# Patient Record
Sex: Male | Born: 1960 | Race: Black or African American | Hispanic: No | Marital: Single | Smoking: Current some day smoker
Health system: Southern US, Community
[De-identification: ages and names within clinical notes are randomized; demographics above are authoritative.]

## PROBLEM LIST (undated history)

## (undated) DIAGNOSIS — K859 Acute pancreatitis without necrosis or infection, unspecified: Secondary | ICD-10-CM

## (undated) DIAGNOSIS — I10 Essential (primary) hypertension: Secondary | ICD-10-CM

## (undated) DIAGNOSIS — E119 Type 2 diabetes mellitus without complications: Secondary | ICD-10-CM

## (undated) DIAGNOSIS — E785 Hyperlipidemia, unspecified: Secondary | ICD-10-CM

## (undated) DIAGNOSIS — J449 Chronic obstructive pulmonary disease, unspecified: Secondary | ICD-10-CM

---

## 2010-09-16 DIAGNOSIS — I10 Essential (primary) hypertension: Secondary | ICD-10-CM | POA: Insufficient documentation

## 2010-09-16 DIAGNOSIS — K859 Acute pancreatitis without necrosis or infection, unspecified: Secondary | ICD-10-CM | POA: Insufficient documentation

## 2010-09-16 DIAGNOSIS — E785 Hyperlipidemia, unspecified: Secondary | ICD-10-CM | POA: Insufficient documentation

## 2010-10-20 DIAGNOSIS — J189 Pneumonia, unspecified organism: Secondary | ICD-10-CM | POA: Insufficient documentation

## 2010-10-20 DIAGNOSIS — E860 Dehydration: Secondary | ICD-10-CM | POA: Insufficient documentation

## 2010-10-20 DIAGNOSIS — F17200 Nicotine dependence, unspecified, uncomplicated: Secondary | ICD-10-CM | POA: Insufficient documentation

## 2010-10-20 HISTORY — DX: Dehydration: E86.0

## 2017-04-11 DIAGNOSIS — R9431 Abnormal electrocardiogram [ECG] [EKG]: Secondary | ICD-10-CM | POA: Insufficient documentation

## 2017-04-11 DIAGNOSIS — E119 Type 2 diabetes mellitus without complications: Secondary | ICD-10-CM | POA: Insufficient documentation

## 2018-01-15 DIAGNOSIS — F17200 Nicotine dependence, unspecified, uncomplicated: Secondary | ICD-10-CM | POA: Insufficient documentation

## 2018-01-15 DIAGNOSIS — J449 Chronic obstructive pulmonary disease, unspecified: Secondary | ICD-10-CM | POA: Insufficient documentation

## 2018-09-07 ENCOUNTER — Encounter: Payer: Self-pay | Admitting: Emergency Medicine

## 2018-09-07 ENCOUNTER — Emergency Department: Payer: BLUE CROSS/BLUE SHIELD

## 2018-09-07 ENCOUNTER — Emergency Department
Admission: EM | Admit: 2018-09-07 | Discharge: 2018-09-07 | Disposition: A | Discharge status: Home / Self Care | Payer: BLUE CROSS/BLUE SHIELD | Attending: Emergency Medicine | Admitting: Emergency Medicine

## 2018-09-07 DIAGNOSIS — R0789 Other chest pain: Secondary | ICD-10-CM | POA: Insufficient documentation

## 2018-09-07 DIAGNOSIS — I10 Essential (primary) hypertension: Secondary | ICD-10-CM | POA: Insufficient documentation

## 2018-09-07 DIAGNOSIS — Z79899 Other long term (current) drug therapy: Secondary | ICD-10-CM | POA: Insufficient documentation

## 2018-09-07 DIAGNOSIS — Z7984 Long term (current) use of oral hypoglycemic drugs: Secondary | ICD-10-CM | POA: Insufficient documentation

## 2018-09-07 DIAGNOSIS — F17210 Nicotine dependence, cigarettes, uncomplicated: Secondary | ICD-10-CM | POA: Diagnosis not present

## 2018-09-07 DIAGNOSIS — E119 Type 2 diabetes mellitus without complications: Secondary | ICD-10-CM | POA: Insufficient documentation

## 2018-09-07 DIAGNOSIS — R079 Chest pain, unspecified: Secondary | ICD-10-CM | POA: Diagnosis present

## 2018-09-07 HISTORY — DX: Essential (primary) hypertension: I10

## 2018-09-07 HISTORY — DX: Hyperlipidemia, unspecified: E78.5

## 2018-09-07 HISTORY — DX: Type 2 diabetes mellitus without complications: E11.9

## 2018-09-07 LAB — CBC WITH DIFFERENTIAL/PLATELET
Abs Immature Granulocytes: 0.01 10*3/uL (ref 0.00–0.07)
BASOS ABS: 0 10*3/uL (ref 0.0–0.1)
Basophils Relative: 0 %
EOS ABS: 0.2 10*3/uL (ref 0.0–0.5)
Eosinophils Relative: 3 %
HEMATOCRIT: 44.5 % (ref 39.0–52.0)
Hemoglobin: 14.5 g/dL (ref 13.0–17.0)
Immature Granulocytes: 0 %
Lymphocytes Relative: 43 %
Lymphs Abs: 4 10*3/uL (ref 0.7–4.0)
MCH: 28.8 pg (ref 26.0–34.0)
MCHC: 32.6 g/dL (ref 30.0–36.0)
MCV: 88.5 fL (ref 80.0–100.0)
Monocytes Absolute: 0.8 10*3/uL (ref 0.1–1.0)
Monocytes Relative: 9 %
Neutro Abs: 4.1 10*3/uL (ref 1.7–7.7)
Neutrophils Relative %: 45 %
Platelets: 259 10*3/uL (ref 150–400)
RBC: 5.03 MIL/uL (ref 4.22–5.81)
RDW: 12.3 % (ref 11.5–15.5)
WBC: 9.3 10*3/uL (ref 4.0–10.5)
nRBC: 0 % (ref 0.0–0.2)

## 2018-09-07 LAB — BASIC METABOLIC PANEL
Anion gap: 5 (ref 5–15)
BUN: 12 mg/dL (ref 6–20)
CALCIUM: 9.1 mg/dL (ref 8.9–10.3)
CO2: 28 mmol/L (ref 22–32)
CREATININE: 0.92 mg/dL (ref 0.61–1.24)
Chloride: 104 mmol/L (ref 98–111)
GFR calc Af Amer: >60 mL/min (ref >60)
Glucose, Bld: 119 mg/dL — ABNORMAL HIGH (ref 70–99)
Potassium: 4.1 mmol/L (ref 3.5–5.1)
Sodium: 137 mmol/L (ref 135–145)

## 2018-09-07 LAB — TROPONIN I
Troponin I: <0.03 ng/mL (ref <0.03)
Troponin I: <0.03 ng/mL (ref <0.03)

## 2018-09-07 IMAGING — CR DG CHEST 2V
1 series · 2 of 2 positions shown · non-contrast
Comparison: None.

CLINICAL DATA: Left-sided chest pain and shortness of breath since
last evening. History of hypertension and diabetes. Current smoker.

EXAM:
CHEST - 2 VIEW

[Series 1: dg chest 2 view · 0.14mm/px · 2 of 2 slices shown]
[im 1/2]
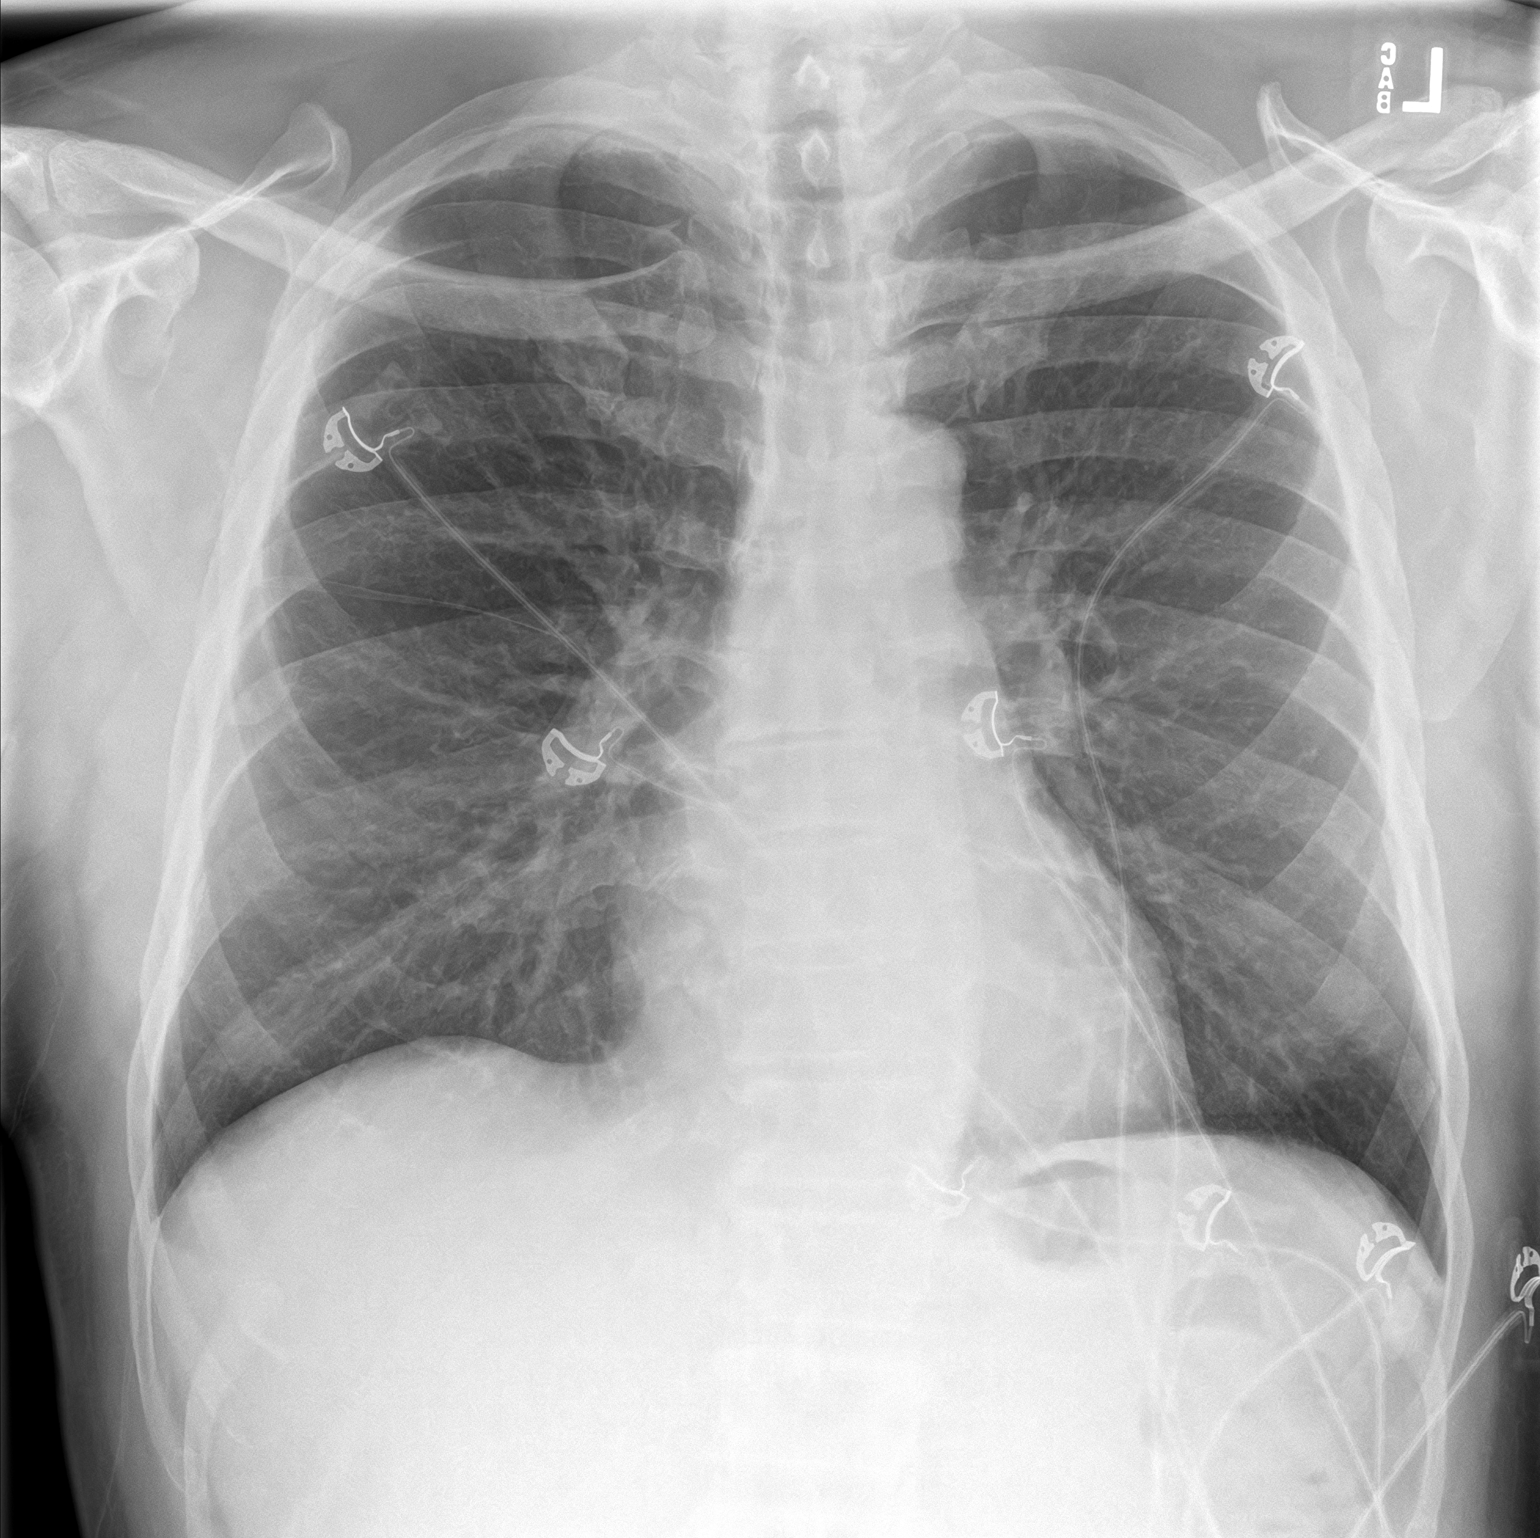
[im 2/2]
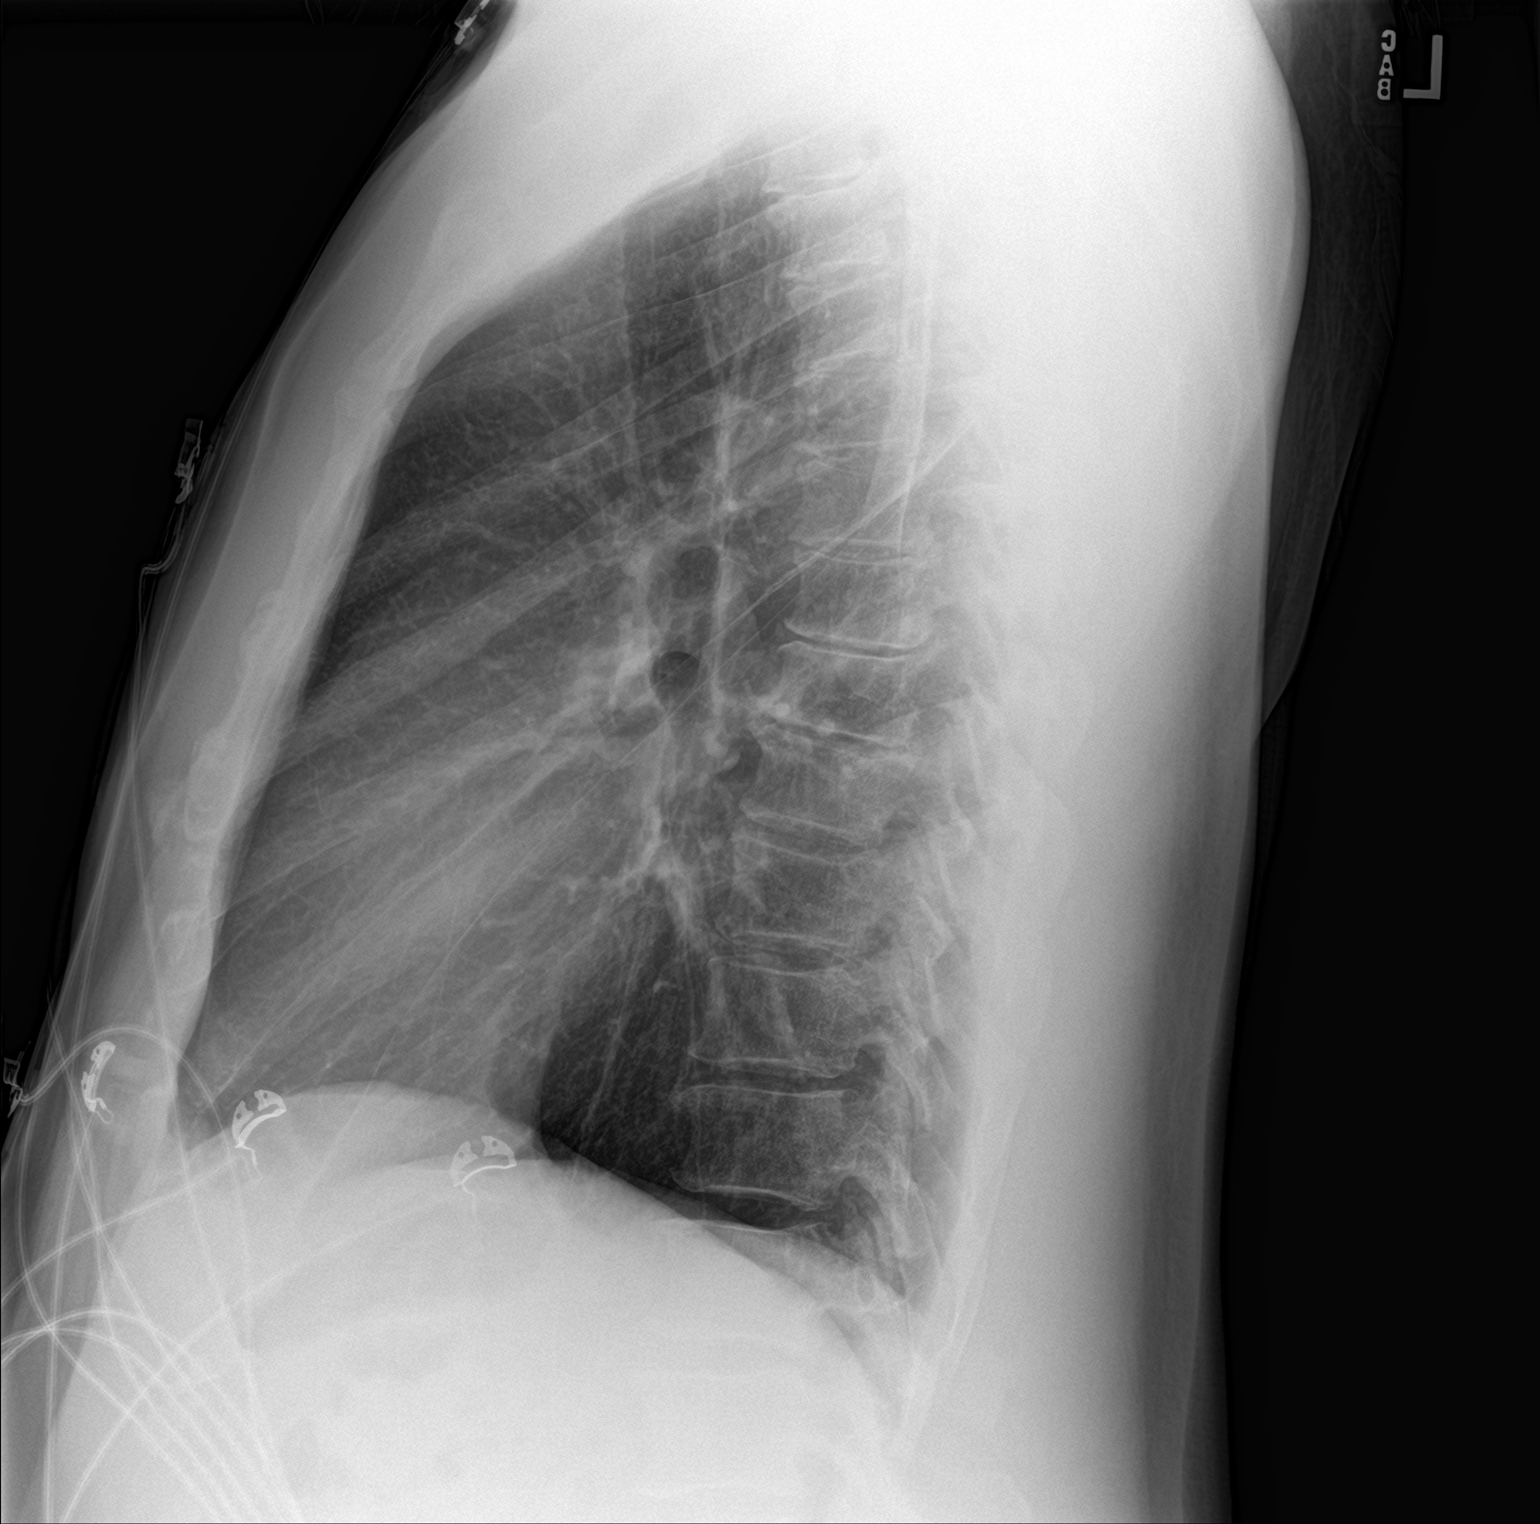

[2 of 2 positions shown; findings below may reference images not displayed]

FINDINGS: Normal cardiac silhouette and mediastinal contours. The lungs are
hyperexpanded with flattening of the diaphragms and mild diffuse
slightly nodular thickening of the pulmonary interstitium. No
discrete focal airspace opacities. No pleural effusion or
pneumothorax. No evidence of edema. No acute osseous abnormalities.
IMPRESSION: Lung hyperexpansion and chronic bronchitic change without
superimposed acute cardiopulmonary disease.

## 2018-09-07 MED ORDER — IBUPROFEN 600 MG PO TABS: 600.0000 mg | ORAL_TABLET | Freq: Four times a day (QID) | ORAL | 0 refills | Status: DC | PRN

## 2018-09-07 MED ORDER — TRAMADOL HCL 50 MG PO TABS: 50.0000 mg | ORAL_TABLET | Freq: Four times a day (QID) | ORAL | 0 refills | Status: AC | PRN

## 2018-09-07 MED ORDER — ONDANSETRON HCL 4 MG/2ML IJ SOLN
4.0000 mg | Freq: Once | INTRAMUSCULAR | Status: AC
  Administered 2018-09-07: 4 mg via INTRAVENOUS
  Filled 2018-09-07: qty 2

## 2018-09-07 MED ORDER — ASPIRIN 81 MG PO CHEW
162.0000 mg | CHEWABLE_TABLET | Freq: Once | ORAL | Status: AC
  Administered 2018-09-07: 162 mg via ORAL
  Filled 2018-09-07: qty 2

## 2018-09-07 MED ORDER — MORPHINE SULFATE (PF) 4 MG/ML IV SOLN
4.0000 mg | Freq: Once | INTRAVENOUS | Status: AC
  Administered 2018-09-07: 4 mg via INTRAVENOUS
  Filled 2018-09-07: qty 1

## 2018-09-07 NOTE — ED Triage Notes (Signed)
Patient presents to the ED with sharp left sided chest pain that began last night while patient was resting.  Patient states he has never had pain like this before and it was difficult to sleep due to the pain.  Patient also reports some shortness of breath.  Speaking in full sentences without difficulty at this time.

## 2018-09-07 NOTE — ED Provider Notes (Signed)
[facility] Emergency Department Provider Note ____________________________________________   First MD Initiated Contact with Patient 09/07/18 0818     (approximate)  I have reviewed the triage vital signs and the nursing notes.   HISTORY  Chief Complaint Chest Pain    HPI Joseph Daugherty is a 58 y.o. male with PMH as noted below who presents with chest pain, gradual onset last night, persistent this morning, described as sharp, and mainly in the left side of his chest.  He states that it radiates down to his left upper quadrant and the left lateral side of his torso.  The patient reports mild associated shortness of breath.  He denies cough, fever, lightheadedness, or nausea.  No prior history of this specific pain although the patient did have a less severe chest pain episode sometime ago.  He also has a remote history of a chest tube in the left chest for a collapsed lung.  He denies alcohol or drug use.   Past Medical History:  Diagnosis Date  . Diabetes mellitus without complication ([place])   . Hyperlipidemia   . Hypertension     There are no active problems to display for this patient.     Prior to Admission medications   Medication Sig Start Date End Date Taking? Authorizing Provider  atenolol (TENORMIN) 50 MG tablet Take 50 mg by mouth daily.   Yes [provider]  dapagliflozin propanediol (FARXIGA) 10 MG TABS tablet Take 10 mg by mouth daily.   Yes [provider]  lisinopril (PRINIVIL,ZESTRIL) 40 MG tablet Take 40 mg by mouth daily.   Yes [provider]  meloxicam (MOBIC) 15 MG tablet Take 15 mg by mouth daily.   Yes [provider]  rosuvastatin (CRESTOR) 10 MG tablet Take 10 mg by mouth daily.   Yes [provider]  sitaGLIPtin-metformin (JANUMET) 50-1000 MG tablet Take 1 tablet by mouth 2 (two) times daily with a meal.   Yes [provider]  ibuprofen (ADVIL,MOTRIN) 600 MG tablet Take 1  tablet (600 mg total) by mouth every 6 (six) hours as needed. 09/07/18   Arta Silence, MD  traMADol (ULTRAM) 50 MG tablet Take 1 tablet (50 mg total) by mouth every 6 (six) hours as needed for up to 5 days. 09/07/18 09/12/18  Arta Silence, MD    Allergies Penicillins  History reviewed. No pertinent family history.  Social History Social History   Tobacco Use  . Smoking status: Current Every Day Smoker    Packs/day: 0.50    Types: Cigarettes  . Smokeless tobacco: Never Used  Substance Use Topics  . Alcohol use: Never    Frequency: Never  . Drug use: Not on file    Review of Systems  Constitutional: No fever. Eyes: No redness. ENT: No neck pain. Cardiovascular: Positive for chest pain. Respiratory: Positive for mild shortness of breath. Gastrointestinal: No vomiting or diarrhea.  Genitourinary: Negative for flank pain.  Musculoskeletal: Negative for back pain. Skin: Negative for rash. Neurological: Negative for headache.   ____________________________________________   PHYSICAL EXAM:  VITAL SIGNS: ED Triage Vitals  Enc Vitals Group     BP 09/07/18 0815 (!) 152/96     Pulse Rate 09/07/18 0815 63     Resp 09/07/18 0815 18     Temp 09/07/18 0815 97.8 F (36.6 C)     Temp Source 09/07/18 0815 Oral     SpO2 09/07/18 0815 99 %     Weight 09/07/18 0816 251 lb (  113.9 kg)     Height 09/07/18 0816 6\' 4"  (1.93 m)     Head Circumference --      Peak Flow --      Pain Score 09/07/18 0816 9     Pain Loc --      Pain Edu? --      Excl. in [place]? --     Constitutional: Alert and oriented.  Uncomfortable but well appearing and in no acute distress. Eyes: Conjunctivae are normal.  Head: Atraumatic. Nose: No congestion/rhinnorhea. Mouth/Throat: Mucous membranes are moist.   Neck: Normal range of motion.  Cardiovascular: Normal rate, regular rhythm. Grossly normal heart sounds.  Good peripheral circulation.  Reproducible left chest wall tenderness. Respiratory:  Normal respiratory effort.  No retractions. Lungs CTAB. Gastrointestinal: Soft and nontender. No distention.  Genitourinary: No flank tenderness. Musculoskeletal: No lower extremity edema.  Extremities warm and well perfused.  Neurologic:  Normal speech and language. No gross focal neurologic deficits are appreciated.  Skin:  Skin is warm and dry. No rash noted. Psychiatric: Mood and affect are normal. Speech and behavior are normal.  ____________________________________________   LABS (all labs ordered are listed, but only abnormal results are displayed)  Labs Reviewed  BASIC METABOLIC PANEL - Abnormal; Notable for the following components:      Result Value   Glucose, Bld 119 (*)    All other components within normal limits  CBC WITH DIFFERENTIAL/PLATELET  TROPONIN I  TROPONIN I   ____________________________________________  EKG  ED ECG REPORT I, Arta Silence, the attending physician, personally viewed and interpreted this ECG.  Date: 09/07/2018 EKG Time: 0812 Rate: 66 Rhythm: normal sinus rhythm with PVCs QRS Axis: normal Intervals: normal ST/T Wave abnormalities: normal Narrative Interpretation: no evidence of acute ischemia  ____________________________________________  RADIOLOGY  CXR: No focal infiltrate or other acute abnormality  ____________________________________________   PROCEDURES  Procedure(s) performed: No  Procedures  Critical Care performed: No ____________________________________________   INITIAL IMPRESSION / ASSESSMENT AND PLAN / ED COURSE  Pertinent labs & imaging results that were available during my care of the patient were reviewed by me and considered in my medical decision making (see chart for details).  58 year old male with a history of diabetes and hypertension presents with atypical, nonexertional left-sided sharp chest pain since last night.  It is associated with mild shortness of breath but no lightheadedness or  nausea.  I reviewed the past medical records in Epic; the patient has had no prior ED visits or admissions here.  On exam the patient is overall relatively well-appearing.  He is hypertensive but his other vital signs are normal.  The pain is somewhat reproducible on exam with change in position of his arm and direct palpation.  The remainder of the exam is unremarkable.  EKG is nonischemic.  Overall based on the positional and reproducible nature of the pain I suspect most likely musculoskeletal cause, however given the patient's age and comorbidities differential also includes ACS.  I have a lower suspicion for PE given the lack of tachycardia or hypoxia, no DVT symptoms, and the lack of risk factors.  I also do not suspect aortic dissection or other vascular cause given the reproducible nature of the pain, the patient's overall well appearance, and the characterization of the pain.  We will obtain chest x-ray, lab work-up, and reassess.  ----------------------------------------- 1:00 PM on 09/07/2018 -----------------------------------------  Lab work-up and chest x-ray were all negative.  Second troponin was also negative.  The patient's pain almost  completely resolved.  I counseled him on the results of the work-up.  There is no evidence of cardiac or other concerning etiology of the pain.  He felt well to go home.  Return precautions given, and he expressed understanding. ____________________________________________   FINAL CLINICAL IMPRESSION(S) / ED DIAGNOSES  Final diagnoses:  Atypical chest pain      NEW MEDICATIONS STARTED DURING THIS VISIT:  Discharge Medication List as of 09/07/2018 12:45 PM    START taking these medications   Details  ibuprofen (ADVIL,MOTRIN) 600 MG tablet Take 1 tablet (600 mg total) by mouth every 6 (six) hours as needed., Starting Sat 09/07/2018, Normal    traMADol (ULTRAM) 50 MG tablet Take 1 tablet (50 mg total) by mouth every 6 (six) hours as  needed for up to 5 days., Starting Sat 09/07/2018, Until Thu 09/12/2018, Normal         Note:  This document was prepared using Dragon voice recognition software and may include unintentional dictation errors.    Arta Silence, MD 09/07/18 [phone]

## 2018-09-07 NOTE — Discharge Instructions (Addendum)
Take the ibuprofen is the primary pain medication, and the tramadol only if needed for more for severe pain.  Return to the ER for new, worsening, or persistent severe pain.  Follow-up with your regular doctor in 1 to 2 weeks.

## 2018-09-25 ENCOUNTER — Emergency Department
Admission: EM | Admit: 2018-09-25 | Discharge: 2018-09-25 | Disposition: A | Discharge status: Home / Self Care | Payer: BLUE CROSS/BLUE SHIELD | Attending: Student in an Organized Health Care Education/Training Program | Admitting: Student in an Organized Health Care Education/Training Program

## 2018-09-25 ENCOUNTER — Other Ambulatory Visit: Payer: Self-pay

## 2018-09-25 ENCOUNTER — Encounter: Payer: Self-pay | Admitting: Emergency Medicine

## 2018-09-25 DIAGNOSIS — F17210 Nicotine dependence, cigarettes, uncomplicated: Secondary | ICD-10-CM | POA: Insufficient documentation

## 2018-09-25 DIAGNOSIS — E119 Type 2 diabetes mellitus without complications: Secondary | ICD-10-CM | POA: Insufficient documentation

## 2018-09-25 DIAGNOSIS — M79644 Pain in right finger(s): Secondary | ICD-10-CM | POA: Insufficient documentation

## 2018-09-25 DIAGNOSIS — L03011 Cellulitis of right finger: Secondary | ICD-10-CM | POA: Diagnosis not present

## 2018-09-25 DIAGNOSIS — Z79899 Other long term (current) drug therapy: Secondary | ICD-10-CM | POA: Insufficient documentation

## 2018-09-25 DIAGNOSIS — Z7984 Long term (current) use of oral hypoglycemic drugs: Secondary | ICD-10-CM | POA: Diagnosis not present

## 2018-09-25 DIAGNOSIS — R2231 Localized swelling, mass and lump, right upper limb: Secondary | ICD-10-CM | POA: Diagnosis present

## 2018-09-25 DIAGNOSIS — I10 Essential (primary) hypertension: Secondary | ICD-10-CM | POA: Insufficient documentation

## 2018-09-25 LAB — COMPREHENSIVE METABOLIC PANEL
ALT: 21 U/L (ref 0–44)
AST: 20 U/L (ref 15–41)
Albumin: 4.8 g/dL (ref 3.5–5.0)
Alkaline Phosphatase: 54 U/L (ref 38–126)
Anion gap: 11 (ref 5–15)
BUN: 13 mg/dL (ref 6–20)
CO2: 24 mmol/L (ref 22–32)
CREATININE: 1.09 mg/dL (ref 0.61–1.24)
Calcium: 9.2 mg/dL (ref 8.9–10.3)
Chloride: 102 mmol/L (ref 98–111)
GFR calc Af Amer: >60 mL/min (ref >60)
GFR calc non Af Amer: >60 mL/min (ref >60)
GLUCOSE: 132 mg/dL — AB (ref 70–99)
Potassium: 4.1 mmol/L (ref 3.5–5.1)
Sodium: 137 mmol/L (ref 135–145)
Total Bilirubin: 0.3 mg/dL (ref 0.3–1.2)
Total Protein: 7.7 g/dL (ref 6.5–8.1)

## 2018-09-25 LAB — LACTIC ACID, PLASMA
Lactic Acid, Venous: 0.8 mmol/L (ref 0.5–1.9)
Lactic Acid, Venous: 1.2 mmol/L (ref 0.5–1.9)

## 2018-09-25 LAB — CBC WITH DIFFERENTIAL/PLATELET
Abs Immature Granulocytes: 0.02 10*3/uL (ref 0.00–0.07)
Basophils Absolute: 0 10*3/uL (ref 0.0–0.1)
Basophils Relative: 0 %
Eosinophils Absolute: 0.2 10*3/uL (ref 0.0–0.5)
Eosinophils Relative: 2 %
HCT: 42.2 % (ref 39.0–52.0)
Hemoglobin: 14 g/dL (ref 13.0–17.0)
Immature Granulocytes: 0 %
Lymphocytes Relative: 35 %
Lymphs Abs: 4 10*3/uL (ref 0.7–4.0)
MCH: 28.8 pg (ref 26.0–34.0)
MCHC: 33.2 g/dL (ref 30.0–36.0)
MCV: 86.8 fL (ref 80.0–100.0)
MONOS PCT: 10 %
Monocytes Absolute: 1.1 10*3/uL — ABNORMAL HIGH (ref 0.1–1.0)
Neutro Abs: 6.2 10*3/uL (ref 1.7–7.7)
Neutrophils Relative %: 53 %
Platelets: 295 10*3/uL (ref 150–400)
RBC: 4.86 MIL/uL (ref 4.22–5.81)
RDW: 12.3 % (ref 11.5–15.5)
WBC: 11.6 10*3/uL — ABNORMAL HIGH (ref 4.0–10.5)
nRBC: 0 % (ref 0.0–0.2)

## 2018-09-25 MED ORDER — HYDROCODONE-ACETAMINOPHEN 5-325 MG PO TABS
1.0000 | ORAL_TABLET | Freq: Once | ORAL | Status: AC
  Administered 2018-09-25: 1 via ORAL
  Filled 2018-09-25: qty 1

## 2018-09-25 MED ORDER — BUPIVACAINE HCL (PF) 0.5 % IJ SOLN
30.0000 mL | Freq: Once | INTRAMUSCULAR | Status: AC
  Administered 2018-09-25: 30 mL
  Filled 2018-09-25: qty 30

## 2018-09-25 NOTE — ED Provider Notes (Signed)
[facility] Emergency Department Provider Note    First MD Initiated Contact with Patient 09/25/18 1858     (approximate)  I have reviewed the triage vital signs and the nursing notes.   HISTORY  Chief Complaint Wound Infection    HPI Joseph Daugherty is a 58 y.o. male diabetes and below listed past medical history supposed recent initiation of Bactrim for right ring finger infection presents with worsening pain swelling and tender lymph nodes in his arm.  Not been having any measured fevers or chills.  No nausea or vomiting.  He is taken 4 doses of Bactrim without any improvement but is developed swelling to the right ring finger.  He is left-hand dominant.    Past Medical History:  Diagnosis Date  . Diabetes mellitus without complication ([place])   . Hyperlipidemia   . Hypertension    History reviewed. No pertinent family history. History reviewed. No pertinent surgical history. There are no active problems to display for this patient.     Prior to Admission medications   Medication Sig Start Date End Date Taking? Authorizing Provider  atenolol (TENORMIN) 50 MG tablet Take 50 mg by mouth daily.    [provider]  dapagliflozin propanediol (FARXIGA) 10 MG TABS tablet Take 10 mg by mouth daily.    [provider]  ibuprofen (ADVIL,MOTRIN) 600 MG tablet Take 1 tablet (600 mg total) by mouth every 6 (six) hours as needed. 09/07/18   Arta Silence, MD  lisinopril (PRINIVIL,ZESTRIL) 40 MG tablet Take 40 mg by mouth daily.    [provider]  meloxicam (MOBIC) 15 MG tablet Take 15 mg by mouth daily.    [provider]  rosuvastatin (CRESTOR) 10 MG tablet Take 10 mg by mouth daily.    [provider]  sitaGLIPtin-metformin (JANUMET) 50-1000 MG tablet Take 1 tablet by mouth 2 (two) times daily with a meal.    [provider]    Allergies Penicillins    Social History Social History   Tobacco  Use  . Smoking status: Current Every Day Smoker    Packs/day: 0.50    Types: Cigarettes  . Smokeless tobacco: Never Used  Substance Use Topics  . Alcohol use: Never    Frequency: Never  . Drug use: Not on file    Review of Systems Patient denies headaches, rhinorrhea, blurry vision, numbness, shortness of breath, chest pain, edema, cough, abdominal pain, nausea, vomiting, diarrhea, dysuria, fevers, rashes or hallucinations unless otherwise stated above in HPI. ____________________________________________   PHYSICAL EXAM:  VITAL SIGNS: Vitals:   09/25/18 1830  BP: (!) 159/92  Pulse: 90  Resp: 16  Temp: 98.7 F (37.1 C)  SpO2: 95%    Constitutional: Alert and oriented.  Eyes: Conjunctivae are normal.  Head: Atraumatic. Nose: No congestion/rhinnorhea. Mouth/Throat: Mucous membranes are moist.   Neck: No stridor. Painless ROM.  Cardiovascular: Normal rate, regular rhythm. Grossly normal heart sounds.  Good peripheral circulation. Respiratory: Normal respiratory effort.  No retractions. Lungs CTAB. Gastrointestinal: Soft and nontender. No distention. No abdominal bruits. No CVA tenderness. Genitourinary:  Musculoskeletal: Swollen tender pulp of the ulnar aspect of the right ring finger consistent with paronychia.  No canal signs.  Does have some proximal reactive lymph nodes but no diffuse cellulitis.  No lower extremity tenderness nor edema.  No joint effusions. Neurologic:  Normal speech and language. No gross focal neurologic deficits are appreciated. No facial droop Skin:  Skin is warm, dry and intact. No rash  noted. Psychiatric: Mood and affect are normal. Speech and behavior are normal.  ____________________________________________   LABS (all labs ordered are listed, but only abnormal results are displayed)  Results for orders placed or performed during the hospital encounter of 09/25/18 (from the past 24 hour(s))  Comprehensive metabolic panel     Status: Abnormal    Collection Time: 09/25/18  6:32 PM  Result Value Ref Range   Sodium 137 135 - 145 mmol/L   Potassium 4.1 3.5 - 5.1 mmol/L   Chloride 102 98 - 111 mmol/L   CO2 24 22 - 32 mmol/L   Glucose, Bld 132 (H) 70 - 99 mg/dL   BUN 13 6 - 20 mg/dL   Creatinine, Ser 1.09 0.61 - 1.24 mg/dL   Calcium 9.2 8.9 - 10.3 mg/dL   Total Protein 7.7 6.5 - 8.1 g/dL   Albumin 4.8 3.5 - 5.0 g/dL   AST 20 15 - 41 U/L   ALT 21 0 - 44 U/L   Alkaline Phosphatase 54 38 - 126 U/L   Total Bilirubin 0.3 0.3 - 1.2 mg/dL   GFR calc non Af Amer >60 >60 mL/min   GFR calc Af Amer >60 >60 mL/min   Anion gap 11 5 - 15  CBC with Differential     Status: Abnormal   Collection Time: 09/25/18  6:32 PM  Result Value Ref Range   WBC 11.6 (H) 4.0 - 10.5 K/uL   RBC 4.86 4.22 - 5.81 MIL/uL   Hemoglobin 14.0 13.0 - 17.0 g/dL   HCT 42.2 39.0 - 52.0 %   MCV 86.8 80.0 - 100.0 fL   MCH 28.8 26.0 - 34.0 pg   MCHC 33.2 30.0 - 36.0 g/dL   RDW 12.3 11.5 - 15.5 %   Platelets 295 150 - 400 K/uL   nRBC 0.0 0.0 - 0.2 %   Neutrophils Relative % 53 %   Neutro Abs 6.2 1.7 - 7.7 K/uL   Lymphocytes Relative 35 %   Lymphs Abs 4.0 0.7 - 4.0 K/uL   Monocytes Relative 10 %   Monocytes Absolute 1.1 (H) 0.1 - 1.0 K/uL   Eosinophils Relative 2 %   Eosinophils Absolute 0.2 0.0 - 0.5 K/uL   Basophils Relative 0 %   Basophils Absolute 0.0 0.0 - 0.1 K/uL   Immature Granulocytes 0 %   Abs Immature Granulocytes 0.02 0.00 - 0.07 K/uL  Lactic acid, plasma     Status: None   Collection Time: 09/25/18  6:32 PM  Result Value Ref Range   Lactic Acid, Venous 0.8 0.5 - 1.9 mmol/L  Lactic acid, plasma     Status: None   Collection Time: 09/25/18  7:22 PM  Result Value Ref Range   Lactic Acid, Venous 1.2 0.5 - 1.9 mmol/L   ____________________________________________  EKG____________________________________________   PROCEDURES  Procedure(s) performed:  Marland KitchenMarland KitchenIncision and Drainage Date/Time: 09/25/2018 8:25 PM Performed by: Merlyn Lot,  MD Authorized by: Merlyn Lot, MD   Consent:    Consent obtained:  Verbal   Consent given by:  Patient   Risks discussed:  Bleeding, infection, incomplete drainage and pain   Alternatives discussed:  Alternative treatment, delayed treatment and observation Location:    Type:  Abscess   Location:  Upper extremity   Upper extremity location:  Finger   Finger location:  R ring finger Pre-procedure details:    Skin preparation:  Betadine Anesthesia (see MAR for exact dosages):    Anesthesia method:  Nerve block  Block location:  Ring block   Block needle gauge:  25 G   Block technique:  Ring   Block injection procedure:  Anatomic landmarks identified   Block outcome:  Anesthesia achieved Procedure type:    Complexity:  Simple Procedure details:    Incision types:  Stab incision   Incision depth:  Subcutaneous   Scalpel blade:  11   Wound management:  Extensive cleaning   Drainage:  Purulent   Drainage amount:  Moderate   Wound treatment:  Wound left open Post-procedure details:    Patient tolerance of procedure:  Tolerated well, no immediate complications      Critical Care performed: no ____________________________________________   INITIAL IMPRESSION / ASSESSMENT AND PLAN / ED COURSE  Pertinent labs & imaging results that were available during my care of the patient were reviewed by me and considered in my medical decision making (see chart for details).   DDX: Phillip Maffei is a 58 y.o. who presents to the ED with  Pain to the right index finger.  He is left-hand dominant.  Has been taking antibiotics.  Exam is consistent with paronychia.  We will plan I&D.    Clinical Course as of Sep 25 2031  Wed Sep 25, 2018  2023 Paronychia successfully drained after ring block.  Patient was written for 10 days of Bactrim which I think is reasonable continue.  Fair amount of purulent drainage obtained from right ring finger.  Patient stable and appropriate for outpatient  follow-up.   [PR]    Clinical Course User Index [PR] Merlyn Lot, MD     As part of my medical decision making, I reviewed the following data within the [place] notes reviewed and incorporated, Labs reviewed, notes from prior ED visits and [place] Controlled Substance Database   ____________________________________________   FINAL CLINICAL IMPRESSION(S) / ED DIAGNOSES  Final diagnoses:  Paronychia of finger of right hand      NEW MEDICATIONS STARTED DURING THIS VISIT:  New Prescriptions   No medications on file     Note:  This document was prepared using Dragon voice recognition software and may include unintentional dictation errors.    Merlyn Lot, MD 09/25/18 2033

## 2018-09-25 NOTE — ED Triage Notes (Signed)
Pt here for infection to right 4th digit. Was seen at urgent care over weekend and put on abx but finger getting worse per pt.  Pain up whole arm. Palpable lymph nodes to upper arm now per pt.  No known fevers.  Pt is diabetic but reports sugars have been under control.

## 2018-09-25 NOTE — ED Notes (Signed)
Patient states right 4 finger started getting infected and went to doctor Saturday for it and was placed on ABT. Patient states infection started a week ago Tuesday. Since then other fingers are swollen and up his arm and hand became numb. Patient states lympnodes are swollen. Pain rate 9/10 in right arm and hand.

## 2018-10-21 DIAGNOSIS — N400 Enlarged prostate without lower urinary tract symptoms: Secondary | ICD-10-CM | POA: Insufficient documentation

## 2018-10-21 HISTORY — DX: Benign prostatic hyperplasia without lower urinary tract symptoms: N40.0

## 2019-04-16 ENCOUNTER — Ambulatory Visit (LOCAL_COMMUNITY_HEALTH_CENTER): Payer: Self-pay

## 2019-04-16 ENCOUNTER — Other Ambulatory Visit: Payer: Self-pay

## 2019-04-16 DIAGNOSIS — Z111 Encounter for screening for respiratory tuberculosis: Secondary | ICD-10-CM

## 2019-04-16 NOTE — Progress Notes (Signed)
TB screen form completed and given to patient.  Copies made of today's TBS and patient record of +PPD and normal CXR (sent for scanning).Aileen Fass, RN

## 2019-05-26 DIAGNOSIS — N529 Male erectile dysfunction, unspecified: Secondary | ICD-10-CM | POA: Insufficient documentation

## 2019-05-26 DIAGNOSIS — J309 Allergic rhinitis, unspecified: Secondary | ICD-10-CM | POA: Insufficient documentation

## 2019-06-11 ENCOUNTER — Other Ambulatory Visit: Payer: Self-pay

## 2019-06-11 ENCOUNTER — Encounter: Payer: Self-pay | Admitting: Physician Assistant

## 2019-06-11 ENCOUNTER — Ambulatory Visit: Payer: Self-pay | Admitting: Physician Assistant

## 2019-06-11 DIAGNOSIS — Z113 Encounter for screening for infections with a predominantly sexual mode of transmission: Secondary | ICD-10-CM

## 2019-06-11 DIAGNOSIS — Z202 Contact with and (suspected) exposure to infections with a predominantly sexual mode of transmission: Secondary | ICD-10-CM

## 2019-06-11 MED ORDER — METRONIDAZOLE 500 MG PO TABS: 2000.0000 mg | ORAL_TABLET | Freq: Once | ORAL | 0 refills | Status: AC

## 2019-06-11 NOTE — Progress Notes (Signed)
    STI clinic/screening visit  Subjective:  Joseph Daugherty is a 58 y.o. male being seen today for an STI screening visit. The patient reports they do not have symptoms.  Patient has the following medical conditions:  There are no active problems to display for this patient.    Chief Complaint  Patient presents with  . SEXUALLY TRANSMITTED DISEASE    HPI  Patient reports that he is not having symptoms but is a contact to [place].  Requests screening today.   See flowsheet for further details and programmatic requirements.    The following portions of the patient's history were reviewed and updated as appropriate: allergies, current medications, past medical history, past social history, past surgical history and problem list.  Objective:  There were no vitals filed for this visit.  Physical Exam Constitutional:      General: He is not in acute distress.    Appearance: Normal appearance.  HENT:     Head: Normocephalic and atraumatic.  Pulmonary:     Effort: Pulmonary effort is normal.  Skin:    General: Skin is warm and dry.  Neurological:     Mental Status: He is alert and oriented to person, place, and time.  Psychiatric:        Mood and Affect: Mood normal.        Behavior: Behavior normal.        Thought Content: Thought content normal.        Judgment: Judgment normal.       Assessment and Plan:  Joseph Daugherty is a 58 y.o. male presenting to the [facility] Department for STI screening  1. Screening for STD (sexually transmitted disease) Patient into clinic without symptoms. Rec condoms with all sex Await test results.  Counseled that RN will call if needs to RTC for further treatment once results are back. - Chlamydia/Gonorrhea [place] [facility] - HIV [place] [facility] - Syphilis Serology, [place] [facility]  2. Venereal disease contact Patient is a contact to [place] and requests treatment. Will treat with Metronidazole 2 g po at one time with food, no EtOH for  24 hr before and until 72 hr after completing medicine. No sex for 7 days and until after partner completes treatment. - metroNIDAZOLE (FLAGYL) 500 MG tablet; Take 4 tablets (2,000 mg total) by mouth once for 1 dose.  Dispense: 4 tablet; Refill: 0     No follow-ups on file.  No future appointments.  Jerene Dilling, PA

## 2019-06-11 NOTE — Progress Notes (Signed)
Pt received tx as a contact to [place] per provider order. Awaiting further test results. Provider orders completed.Ronny Bacon, RN

## 2019-06-12 ENCOUNTER — Telehealth: Payer: Self-pay | Admitting: Family Medicine

## 2019-06-12 NOTE — Telephone Encounter (Signed)
Patient wants tes tresults

## 2019-06-12 NOTE — Telephone Encounter (Signed)
Phone call to pt. Pt states he has already talked with someone at [facility] and does not need further assistance at this time.

## 2019-07-02 ENCOUNTER — Other Ambulatory Visit: Payer: Self-pay

## 2019-07-02 ENCOUNTER — Ambulatory Visit: Payer: Self-pay

## 2019-07-02 DIAGNOSIS — Z708 Other sex counseling: Secondary | ICD-10-CM

## 2019-07-02 DIAGNOSIS — Z717 Human immunodeficiency virus [HIV] counseling: Secondary | ICD-10-CM

## 2019-07-02 NOTE — Progress Notes (Signed)
ROI signed and copies of 06/11/2019 STI testing results provided to client per [facility] policy. Rich Number, RN  Male accompanied client during appt today. Rich Number, RN

## 2019-12-13 ENCOUNTER — Other Ambulatory Visit: Payer: Self-pay

## 2019-12-13 ENCOUNTER — Emergency Department
Admission: EM | Admit: 2019-12-13 | Discharge: 2019-12-13 | Disposition: A | Discharge status: Home / Self Care | Payer: BC Managed Care – PPO | Attending: Emergency Medicine | Admitting: Emergency Medicine

## 2019-12-13 ENCOUNTER — Emergency Department: Payer: BC Managed Care – PPO

## 2019-12-13 ENCOUNTER — Encounter: Payer: Self-pay | Admitting: Emergency Medicine

## 2019-12-13 DIAGNOSIS — E1165 Type 2 diabetes mellitus with hyperglycemia: Secondary | ICD-10-CM | POA: Insufficient documentation

## 2019-12-13 DIAGNOSIS — M5412 Radiculopathy, cervical region: Secondary | ICD-10-CM | POA: Insufficient documentation

## 2019-12-13 DIAGNOSIS — I10 Essential (primary) hypertension: Secondary | ICD-10-CM | POA: Diagnosis not present

## 2019-12-13 DIAGNOSIS — M25511 Pain in right shoulder: Secondary | ICD-10-CM

## 2019-12-13 DIAGNOSIS — F17210 Nicotine dependence, cigarettes, uncomplicated: Secondary | ICD-10-CM | POA: Diagnosis not present

## 2019-12-13 LAB — BASIC METABOLIC PANEL
Anion gap: 9 (ref 5–15)
BUN: 11 mg/dL (ref 6–20)
CO2: 25 mmol/L (ref 22–32)
Calcium: 9 mg/dL (ref 8.9–10.3)
Chloride: 102 mmol/L (ref 98–111)
Creatinine, Ser: 1.04 mg/dL (ref 0.61–1.24)
GFR calc Af Amer: >60 mL/min (ref >60)
GFR calc non Af Amer: >60 mL/min (ref >60)
Glucose, Bld: 104 mg/dL — ABNORMAL HIGH (ref 70–99)
Potassium: 3.9 mmol/L (ref 3.5–5.1)
Sodium: 136 mmol/L (ref 135–145)

## 2019-12-13 LAB — CBC WITH DIFFERENTIAL/PLATELET
Abs Immature Granulocytes: 0.02 10*3/uL (ref 0.00–0.07)
Basophils Absolute: 0.1 10*3/uL (ref 0.0–0.1)
Basophils Relative: 1 %
Eosinophils Absolute: 0.2 10*3/uL (ref 0.0–0.5)
Eosinophils Relative: 2 %
HCT: 43.9 % (ref 39.0–52.0)
Hemoglobin: 14.9 g/dL (ref 13.0–17.0)
Immature Granulocytes: 0 %
Lymphocytes Relative: 36 %
Lymphs Abs: 3.8 10*3/uL (ref 0.7–4.0)
MCH: 29.2 pg (ref 26.0–34.0)
MCHC: 33.9 g/dL (ref 30.0–36.0)
MCV: 85.9 fL (ref 80.0–100.0)
Monocytes Absolute: 1 10*3/uL (ref 0.1–1.0)
Monocytes Relative: 10 %
Neutro Abs: 5.5 10*3/uL (ref 1.7–7.7)
Neutrophils Relative %: 51 %
Platelets: 264 10*3/uL (ref 150–400)
RBC: 5.11 MIL/uL (ref 4.22–5.81)
RDW: 13.2 % (ref 11.5–15.5)
WBC: 10.6 10*3/uL — ABNORMAL HIGH (ref 4.0–10.5)
nRBC: 0 % (ref 0.0–0.2)

## 2019-12-13 MED ORDER — DEXAMETHASONE SODIUM PHOSPHATE 10 MG/ML IJ SOLN
10.0000 mg | Freq: Once | INTRAMUSCULAR | Status: AC
  Administered 2019-12-13: 10 mg via INTRAMUSCULAR
  Filled 2019-12-13: qty 1

## 2019-12-13 MED ORDER — LIDOCAINE 5 % EX PTCH: 1.0000 | MEDICATED_PATCH | Freq: Two times a day (BID) | CUTANEOUS | 0 refills | Status: AC

## 2019-12-13 MED ORDER — LIDOCAINE 5 % EX PTCH
1.0000 | MEDICATED_PATCH | CUTANEOUS | Status: DC
  Administered 2019-12-13: 1 via TRANSDERMAL
  Filled 2019-12-13: qty 1

## 2019-12-13 MED ORDER — OXYCODONE-ACETAMINOPHEN 5-325 MG PO TABS
1.0000 | ORAL_TABLET | Freq: Once | ORAL | Status: AC
  Administered 2019-12-13: 1 via ORAL
  Filled 2019-12-13: qty 1

## 2019-12-13 MED ORDER — OXYCODONE-ACETAMINOPHEN 5-325 MG PO TABS: 1.0000 | ORAL_TABLET | Freq: Four times a day (QID) | ORAL | 0 refills | Status: AC | PRN

## 2019-12-13 MED ORDER — METHYLPREDNISOLONE 4 MG PO TBPK
ORAL_TABLET | ORAL | 0 refills | Status: DC
Start: 2019-12-13 — End: 2020-01-09

## 2019-12-13 NOTE — ED Notes (Signed)
See triage note  Presents with a 10 day of right shoulder pain  Denies any injury  But states he is having decreased ROM   Good pulses

## 2019-12-13 NOTE — ED Triage Notes (Signed)
Pt to ED via POV c/o right shoulder pain. Pt denies injury. Pt states that he has decreased ROM. Pt is in NAD.

## 2019-12-13 NOTE — ED Triage Notes (Signed)
First nurse note- here for right shoulder and arm pain. Denies injury.

## 2019-12-13 NOTE — Discharge Instructions (Addendum)
Follow discharge care instruction take medication as directed.  Wear arm sling until evaluation by orthopedics.  Closely monitor blood sugar while taking steroids.

## 2019-12-13 NOTE — ED Provider Notes (Signed)
[facility] Emergency Department Provider Note   ____________________________________________   First MD Initiated Contact with Patient 12/13/19 1414     (approximate)  I have reviewed the triage vital signs and the nursing notes.   HISTORY  Chief Complaint Shoulder Pain    HPI Joseph Daugherty is a 59 y.o. male patient complained of 10 days of increasing superior/posterior right shoulder pain.  Patient state decreased range of motion and pain with sleeping on area.  Patient also complained of numbness and tingling to the right upper extremity.  Patient rates pain as 8/10.  Patient described pain as "achy".  No palliative measures for complaint         Past Medical History:  Diagnosis Date  . Diabetes mellitus without complication ([place])   . Hyperlipidemia   . Hypertension     There are no problems to display for this patient.   History reviewed. No pertinent surgical history.  Prior to Admission medications   Medication Sig Start Date End Date Taking? Authorizing Provider  atenolol (TENORMIN) 50 MG tablet Take 50 mg by mouth daily.    [provider]  dapagliflozin propanediol (FARXIGA) 10 MG TABS tablet Take 10 mg by mouth daily.    [provider]  ibuprofen (ADVIL,MOTRIN) 600 MG tablet Take 1 tablet (600 mg total) by mouth every 6 (six) hours as needed. 09/07/18   Arta Silence, MD  lidocaine (LIDODERM) 5 % Place 1 patch onto the skin every 12 (twelve) hours. Remove & Discard patch within 12 hours or as directed by MD 12/13/19 12/12/20  Sable Feil, PA-C  lisinopril (PRINIVIL,ZESTRIL) 40 MG tablet Take 40 mg by mouth daily.    [provider]  meloxicam (MOBIC) 15 MG tablet Take 15 mg by mouth daily.    [provider]  methylPREDNISolone (MEDROL DOSEPAK) 4 MG TBPK tablet Take Tapered dose as directed 12/13/19   Sable Feil, PA-C  oxyCODONE-acetaminophen (PERCOCET) 5-325 MG tablet Take 1 tablet by  mouth every 6 (six) hours as needed for up to 5 days for severe pain. 12/13/19 12/18/19  Sable Feil, PA-C  rosuvastatin (CRESTOR) 10 MG tablet Take 10 mg by mouth daily.    [provider]  sitaGLIPtin-metformin (JANUMET) 50-1000 MG tablet Take 1 tablet by mouth 2 (two) times daily with a meal.    [provider]    Allergies Penicillins  No family history on file.  Social History Social History   Tobacco Use  . Smoking status: Current Every Day Smoker    Packs/day: 0.50    Types: Cigarettes  . Smokeless tobacco: Never Used  Substance Use Topics  . Alcohol use: Never  . Drug use: Not Currently    Review of Systems  Constitutional: No fever/chills Eyes: No visual changes. ENT: No sore throat. Cardiovascular: Denies chest pain. Respiratory: Denies shortness of breath. Gastrointestinal: No abdominal pain.  No nausea, no vomiting.  No diarrhea.  No constipation. Genitourinary: Negative for dysuria. Musculoskeletal: Negative for back pain. Skin: Negative for rash. Neurological: Negative for headaches, focal weakness or numbness. Endocrine:  Diabetes, hyperlipidemia, and hypertension. Allergic/Immunilogical: Penicillin ____________________________________________   PHYSICAL EXAM:  VITAL SIGNS: ED Triage Vitals  Enc Vitals Group     BP 12/13/19 1345 134/85     Pulse Rate 12/13/19 1345 86     Resp 12/13/19 1345 16     Temp 12/13/19 1345 98.2 F (36.8 C)     Temp Source 12/13/19 1345 Oral  SpO2 12/13/19 1345 98 %     Weight 12/13/19 1410 235 lb 14.3 oz (107 kg)     Height 12/13/19 1410 6\' 4"  (1.93 m)     Head Circumference --      Peak Flow --      Pain Score 12/13/19 1347 8     Pain Loc --      Pain Edu? --      Excl. in [place]? --    Constitutional: Alert and oriented. Well appearing and in no acute distress. Cardiovascular: Normal rate, regular rhythm. Grossly normal heart sounds.  Good peripheral circulation. Respiratory: Normal respiratory  effort.  No retractions. Lungs CTAB. Musculoskeletal:  Neurologic:  Normal speech and language. No gross focal neurologic deficits are appreciated. No gait instability. Skin:  Skin is warm, dry and intact. No rash noted. Psychiatric: Mood and affect are normal. Speech and behavior are normal.  ____________________________________________   LABS (all labs ordered are listed, but only abnormal results are displayed)  Labs Reviewed  BASIC METABOLIC PANEL - Abnormal; Notable for the following components:      Result Value   Glucose, Bld 104 (*)    All other components within normal limits  CBC WITH DIFFERENTIAL/PLATELET - Abnormal; Notable for the following components:   WBC 10.6 (*)    All other components within normal limits   ____________________________________________  EKG   ____________________________________________  RADIOLOGY  ED MD interpretation:    Official radiology report(s): DG Shoulder Right  Result Date: 12/13/2019 CLINICAL DATA:  Shoulder pain, no known injury EXAM: RIGHT SHOULDER - 2+ VIEW COMPARISON:  None. FINDINGS: There is no evidence of fracture or dislocation. Mild glenohumeral and acromioclavicular arthrosis. Soft tissues are unremarkable. IMPRESSION: No fracture or dislocation of the right shoulder. Mild glenohumeral and acromioclavicular arthrosis. Electronically Signed   By: Eddie Candle M.D.   On: 12/13/2019 14:58    ____________________________________________   PROCEDURES  Procedure(s) performed (including Critical Care):  Procedures   ____________________________________________   INITIAL IMPRESSION / ASSESSMENT AND PLAN / ED COURSE  As part of my medical decision making, I reviewed the following data within the [place]     Patient presents with right arm pain and intermittent numbness to the right upper extremity.  Discussed x-ray findings with patient.  Patient placed in arm sling and given discharge care  instruction.  Patient advised to follow-up with orthopedic for definitive evaluation and treatment.  Patient advised on drug effects of pain medication.    Joseph Daugherty was evaluated in Emergency Department on 12/13/2019 for the symptoms described in the history of present illness. He was evaluated in the context of the global COVID-19 pandemic, which necessitated consideration that the patient might be at risk for infection with the SARS-CoV-2 virus that causes COVID-19. Institutional protocols and algorithms that pertain to the evaluation of patients at risk for COVID-19 are in a state of rapid change based on information released by regulatory bodies including the CDC and federal and state organizations. These policies and algorithms were followed during the patient's care in the ED.       ____________________________________________   FINAL CLINICAL IMPRESSION(S) / ED DIAGNOSES  Final diagnoses:  Acute pain of right shoulder  Cervical radiculopathy     ED Discharge Orders         Ordered    methylPREDNISolone (MEDROL DOSEPAK) 4 MG TBPK tablet     12/13/19 1522    oxyCODONE-acetaminophen (PERCOCET) 5-325 MG tablet  Every 6 hours  PRN     12/13/19 1522    lidocaine (LIDODERM) 5 %  Every 12 hours     12/13/19 1522           Note:  This document was prepared using Dragon voice recognition software and may include unintentional dictation errors.    Sable Feil, PA-C 12/13/19 1524    Harvest Dark, MD 12/14/19 [phone]

## 2020-01-02 ENCOUNTER — Other Ambulatory Visit: Payer: Self-pay | Admitting: Orthopedic Surgery

## 2020-01-02 DIAGNOSIS — M75101 Unspecified rotator cuff tear or rupture of right shoulder, not specified as traumatic: Secondary | ICD-10-CM

## 2020-01-07 DIAGNOSIS — M25511 Pain in right shoulder: Secondary | ICD-10-CM | POA: Insufficient documentation

## 2020-01-09 ENCOUNTER — Other Ambulatory Visit: Payer: Self-pay

## 2020-01-09 ENCOUNTER — Encounter: Payer: Self-pay | Admitting: Urology

## 2020-01-09 ENCOUNTER — Ambulatory Visit (INDEPENDENT_AMBULATORY_CARE_PROVIDER_SITE_OTHER): Payer: BC Managed Care – PPO | Admitting: Urology

## 2020-01-09 VITALS — BP 121/84 | HR 94 | Ht 76.0 in | Wt 230.0 lb

## 2020-01-09 DIAGNOSIS — R351 Nocturia: Secondary | ICD-10-CM | POA: Diagnosis not present

## 2020-01-09 DIAGNOSIS — R972 Elevated prostate specific antigen [PSA]: Secondary | ICD-10-CM | POA: Diagnosis not present

## 2020-01-09 DIAGNOSIS — N401 Enlarged prostate with lower urinary tract symptoms: Secondary | ICD-10-CM

## 2020-01-09 DIAGNOSIS — N138 Other obstructive and reflux uropathy: Secondary | ICD-10-CM

## 2020-01-09 NOTE — Patient Instructions (Signed)
Prostate Cancer Screening  Prostate cancer screening is a test that is done to check for the presence of prostate cancer in men. The prostate gland is a walnut-sized gland that is located below the bladder and in front of the rectum in males. The function of the prostate is to add fluid to semen during ejaculation. Prostate cancer is the second most common type of cancer in men. Who should have prostate cancer screening?  Screening recommendations vary based on age and other risk factors. Screening is recommended if:  You are older than age 55. If you are age 55-69, talk with your health care provider about your need for screening and how often screening should be done. Because most prostate cancers are slow growing and will not cause death, screening is generally reserved in this age group for men who have a 10-15-year life expectancy.  You are younger than age 55, and you have these risk factors: ? Being a black male or a male of African descent. ? Having a father, brother, or uncle who has been diagnosed with prostate cancer. The risk is higher if your family member's cancer occurred at an early age. Screening is not recommended if:  You are younger than age 40.  You are between the ages of 40 and 54 and you have no risk factors.  You are 70 years of age or older. At this age, the risks that screening can cause are greater than the benefits that it may provide. If you are at high risk for prostate cancer, your health care provider may recommend that you have screenings more often or that you start screening at a younger age. How is screening for prostate cancer done? The recommended prostate cancer screening test is a blood test called the prostate-specific antigen (PSA) test. PSA is a protein that is made in the prostate. As you age, your prostate naturally produces more PSA. Abnormally high PSA levels may be caused by:  Prostate cancer.  An enlarged prostate that is not caused by cancer  (benign prostatic hyperplasia, BPH). This condition is very common in older men.  A prostate gland infection (prostatitis). Depending on the PSA results, you may need more tests, such as:  A physical exam to check the size of your prostate gland.  Blood and imaging tests.  A procedure to remove tissue samples from your prostate gland for testing (biopsy). What are the benefits of prostate cancer screening?  Screening can help to identify cancer at an early stage, before symptoms start and when the cancer can be treated more easily.  There is a small chance that screening may lower your risk of dying from prostate cancer. The chance is small because prostate cancer is a slow-growing cancer, and most men with prostate cancer die from a different cause. What are the risks of prostate cancer screening? The main risk of prostate cancer screening is diagnosing and treating prostate cancer that would never have caused any symptoms or problems. This is called overdiagnosisand overtreatment. PSA screening cannot tell you if your PSA is high due to cancer or a different cause. A prostate biopsy is the only procedure to diagnose prostate cancer. Even the results of a biopsy may not tell you if your cancer needs to be treated. Slow-growing prostate cancer may not need any treatment other than monitoring, so diagnosing and treating it may cause unnecessary stress or other side effects. A prostate biopsy may also cause:  Infection or fever.  A false negative. This is   a result that shows that you do not have prostate cancer when you actually do have prostate cancer. Questions to ask your health care provider  When should I start prostate cancer screening?  What is my risk for prostate cancer?  How often do I need screening?  What type of screening tests do I need?  How do I get my test results?  What do my results mean?  Do I need treatment? Where to find more information  The American Cancer  Society: www.cancer.org  American Urological Association: www.auanet.org Contact a health care provider if:  You have difficulty urinating.  You have pain when you urinate or ejaculate.  You have blood in your urine or semen.  You have pain in your back or in the area of your prostate. Summary  Prostate cancer is a common type of cancer in men. The prostate gland is located below the bladder and in front of the rectum. This gland adds fluid to semen during ejaculation.  Prostate cancer screening may identify cancer at an early stage, when the cancer can be treated more easily.  The prostate-specific antigen (PSA) test is the recommended screening test for prostate cancer.  Discuss the risks and benefits of prostate cancer screening with your health care provider. If you are age 70 or older, the risks that screening can cause are greater than the benefits that it may provide. This information is not intended to replace advice given to you by your health care provider. Make sure you discuss any questions you have with your health care provider. Document Revised: 02/20/2019 Document Reviewed: 02/20/2019 Elsevier Patient Education  2020 Elsevier Inc.  

## 2020-01-09 NOTE — Progress Notes (Signed)
01/09/2020 2:02 PM   Joseph Daugherty January 02, 1961 [id number]  Referring provider: [name], MD [place],  [place] [address]  Chief Complaint  Patient presents with  . New Patient (Initial Visit)    HPI: Joseph Daugherty was sent over for PSA elevation. His 06/21 PSA was 6.74. His 07/18 PSA was 2.3. No h/o BPH. No FH PCa.   He has occasional difficulty urinating. Symptoms better after tamsulosin started. His AUA symptom score is 5. Noc improved. He has a history of erectile dysfunction.   He is a Licensed conveyancer.   PMH: Past Medical History:  Diagnosis Date  . Diabetes mellitus without complication ([place])   . Hyperlipidemia   . Hypertension     Surgical History: No past surgical history on file.  Home Medications:  Allergies as of 01/09/2020      Reactions   Penicillins Hives, Anaphylaxis      Medication List       Accurate as of January 09, 2020  2:02 PM. If you have any questions, ask your nurse or doctor.        STOP taking these medications   methylPREDNISolone 4 MG Tbpk tablet Commonly known as: MEDROL DOSEPAK Stopped by: Joseph Aloe, MD     TAKE these medications   atenolol 50 MG tablet Commonly known as: TENORMIN Take 50 mg by mouth daily.   Farxiga 10 MG Tabs tablet Generic drug: dapagliflozin propanediol Take 10 mg by mouth daily.   ibuprofen 600 MG tablet Commonly known as: ADVIL Take 1 tablet (600 mg total) by mouth every 6 (six) hours as needed.   lidocaine 5 % Commonly known as: Lidoderm Place 1 patch onto the skin every 12 (twelve) hours. Remove & Discard patch within 12 hours or as directed by MD   lisinopril 40 MG tablet Commonly known as: ZESTRIL Take 40 mg by mouth daily.   meloxicam 15 MG tablet Commonly known as: MOBIC Take 15 mg by mouth daily.   rosuvastatin 10 MG tablet Commonly known as: CRESTOR Take 10 mg by mouth daily.   sitaGLIPtin-metformin 50-1000 MG tablet Commonly known as:  JANUMET Take 1 tablet by mouth 2 (two) times daily with a meal.       Allergies:  Allergies  Allergen Reactions  . Penicillins Hives and Anaphylaxis    Family History: No family history on file.  Social History:  reports that he has been smoking cigarettes. He has been smoking about 0.50 packs per day. He has never used smokeless tobacco. He reports previous drug use. He reports that he does not drink alcohol.   Physical Exam: BP 121/84   Pulse 94   Ht 6\' 4"  (1.93 m)   Wt 230 lb (104.3 kg)   BMI 28.00 kg/m   Constitutional:  Alert and oriented, No acute distress. HEENT: [place] AT, moist mucus membranes.  Trachea midline, no masses. Cardiovascular: No clubbing, cyanosis, or edema. Respiratory: Normal respiratory effort, no increased work of breathing. GI: Abdomen is soft, nontender, nondistended, no abdominal masses GU: No CVA tenderness; DRE is 30 grams. No specific nodules. Landmarks preserved.  Lymph: No cervical or inguinal lymphadenopathy. Skin: No rashes, bruises or suspicious lesions. Neurologic: Grossly intact, no focal deficits, moving all 4 extremities. Psychiatric: Normal mood and affect.  Laboratory Data: Lab Results  Component Value Date   WBC 10.6 (H) 12/13/2019   HGB 14.9 12/13/2019   HCT 43.9 12/13/2019   MCV 85.9 12/13/2019   PLT 264 12/13/2019  Lab Results  Component Value Date   CREATININE 1.04 12/13/2019    No results found for: PSA  No results found for: TESTOSTERONE  No results found for: HGBA1C  Urinalysis No results found for: COLORURINE, APPEARANCEUR, LABSPEC, PHURINE, GLUCOSEU, HGBUR, BILIRUBINUR, KETONESUR, PROTEINUR, UROBILINOGEN, NITRITE, LEUKOCYTESUR  No results found for: LABMICR, [place], RBCUA, LABEPIT, MUCUS, BACTERIA  Pertinent Imaging: n/a No results found for this or any previous visit.  No results found for this or any previous visit.  No results found for this or any previous visit.  No results found for this or  any previous visit.  No results found for this or any previous visit.  No results found for this or any previous visit.  No results found for this or any previous visit.  No results found for this or any previous visit.   Assessment & Plan:    1. Elevated PSA - I had a long discussion with the patient on the nature of elevated PSA - benign vs malignant causes. We discussed age specific levels and that PCa can be seen on a biopsy with very low PSA levels (<=2.5). We discussed the nature risks and benefits of continued surveillance, other lab tests, imaging as well as prostate biopsy. We discussed the management of prostate cancer might include active surveillance or treatment depending on biopsy findings. All questions answered. PSA was sent.   2. BPH - cont tamsulosin  - Urinalysis, Complete   No follow-ups on file.  Joseph Aloe, MD  [facility] [address] [place], [place] [address] [phone]

## 2020-01-10 LAB — PSA, TOTAL AND FREE
PSA, Free Pct: 12.7 %
PSA, Free: 0.76 ng/mL
Prostate Specific Ag, Serum: 6 ng/mL — ABNORMAL HIGH (ref 0.0–4.0)

## 2020-01-12 LAB — URINALYSIS, COMPLETE
Bilirubin, UA: NEGATIVE
Ketones, UA: NEGATIVE
Leukocytes,UA: NEGATIVE
Nitrite, UA: NEGATIVE
Protein,UA: NEGATIVE
RBC, UA: NEGATIVE
Specific Gravity, UA: 1.02 (ref 1.005–1.030)
Urobilinogen, Ur: 0.2 mg/dL (ref 0.2–1.0)
pH, UA: 7 (ref 5.0–7.5)

## 2020-01-12 LAB — MICROSCOPIC EXAMINATION: Bacteria, UA: NONE SEEN

## 2020-01-14 ENCOUNTER — Telehealth: Payer: Self-pay

## 2020-01-14 NOTE — Telephone Encounter (Signed)
-----   Message from Festus Aloe, MD sent at 01/13/2020  2:28 PM EDT ----- [place], let Mr Paskett know his PSA remains elevated at 6, so I recommend we go ahead and schedule a prostate biopsy. Go over instructions with him and schedule for next available prostate biopsy and follow-up. Thanks, Dr. Johnette Abraham  ----- Message ----- From: Evelina Bucy, CMA Sent: 01/12/2020   4:40 PM EDT To: Festus Aloe, MD   ----- Message ----- From: Interface, [facility] Lab Results In Sent: 01/10/2020   7:38 AM EDT To: [facility]

## 2020-01-14 NOTE — Telephone Encounter (Signed)
Notified patient as advised. Went over the biopsy instructions, scheduled the biopsy and results appointment. Answered any questions the patient had, patient verbalized his understanding. Appt reminder along with biopsy instructions printed and mailed.

## 2020-01-15 ENCOUNTER — Other Ambulatory Visit: Payer: Self-pay

## 2020-01-15 ENCOUNTER — Ambulatory Visit
Admission: RE | Admit: 2020-01-15 | Discharge: 2020-01-15 | Disposition: A | Discharge status: Home / Self Care | Payer: BC Managed Care – PPO | Source: Ambulatory Visit | Attending: Orthopedic Surgery | Admitting: Orthopedic Surgery

## 2020-01-15 DIAGNOSIS — M75101 Unspecified rotator cuff tear or rupture of right shoulder, not specified as traumatic: Secondary | ICD-10-CM | POA: Insufficient documentation

## 2020-01-15 IMAGING — MR MR SHOULDER*R* W/O CM
5 series · 32 of 40 positions shown · non-contrast
Comparison: Plain films right shoulder [DATE].

CLINICAL DATA: Right shoulder pain and limited range of motion for
1 month. No known injury. Plain films right shoulder [DATE].

EXAM:
MRI OF THE RIGHT SHOULDER WITHOUT CONTRAST
TECHNIQUE: Multiplanar, multisequence MR imaging of the shoulder was performed.
No intravenous contrast was administered.

[Series 5: PD fat-sat · axial · right · 4.0mm · 0.55mm/px · z∈[-20,+109]mm · 8 of 28 slices shown (1 of 2)]
[im 1/28]
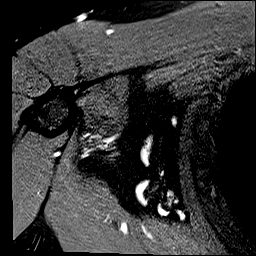
[im 4/28]
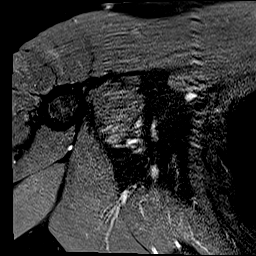
[im 10/28]
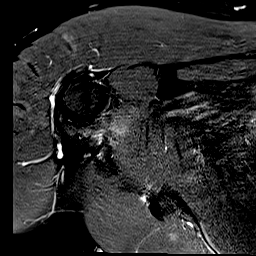
[im 13/28]
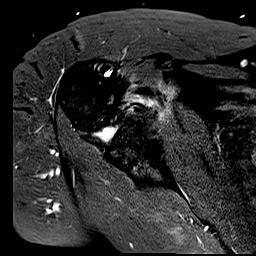
[im 16/28]
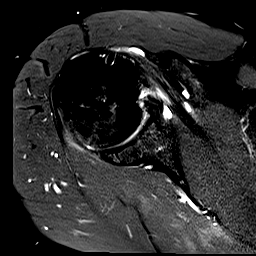
[im 19/28]
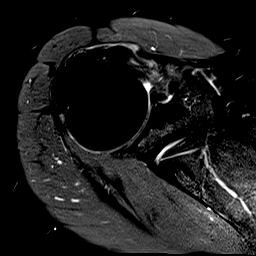
[im 25/28]
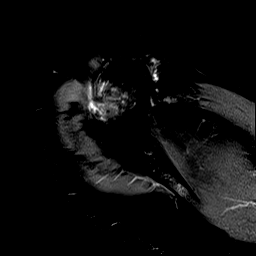
[im 28/28]
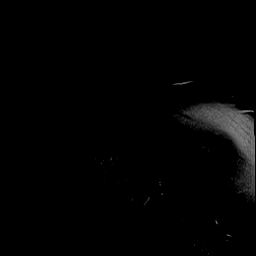

[Series 6: PD fat-sat · oblique · right · 4.0mm · 0.44mm/px · 8 of 26 slices shown (2 of 2)]
[im 1/26]
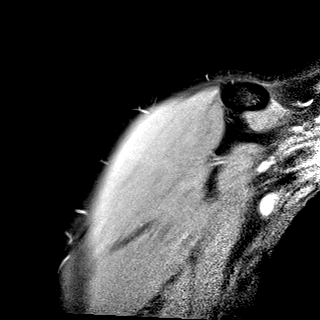
[im 4/26]
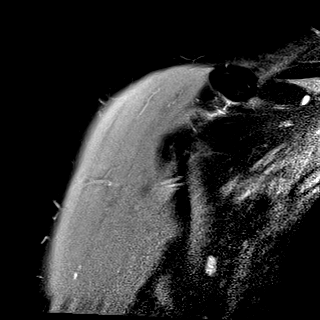
[im 8/26]
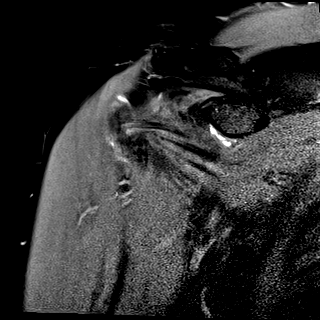
[im 11/26]
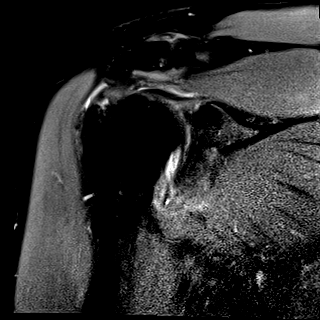
[im 15/26]
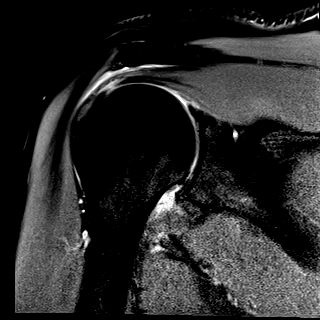
[im 18/26]
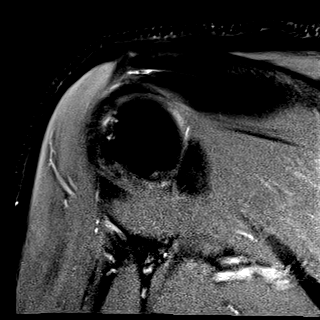
[im 22/26]
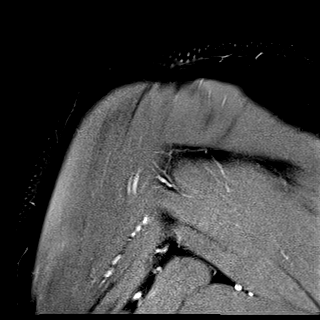
[im 26/26]
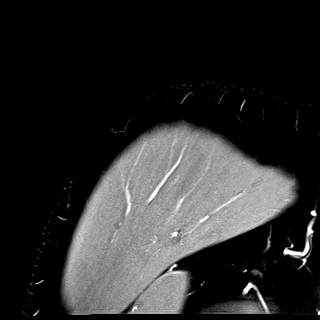

[Series 7: T2 fat-sat · oblique · right · 4.0mm · 0.44mm/px · 8 of 26 slices shown (1 of 2)]
[im 1/26]
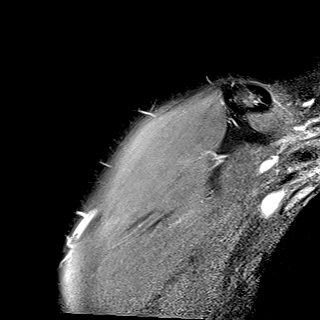
[im 4/26]
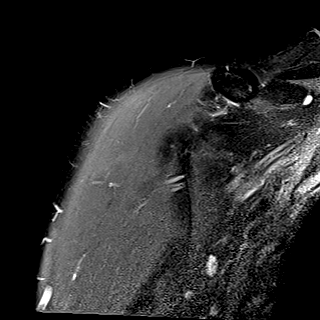
[im 8/26]
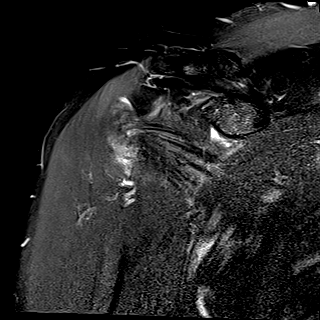
[im 11/26]
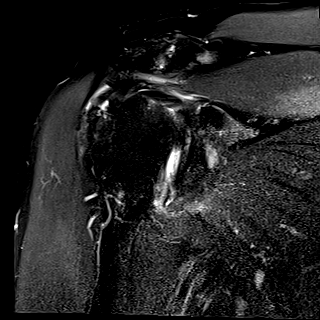
[im 15/26]
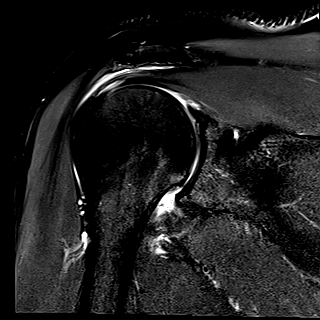
[im 18/26]
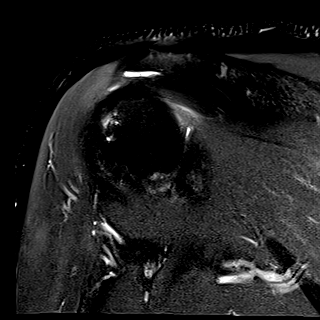
[im 22/26]
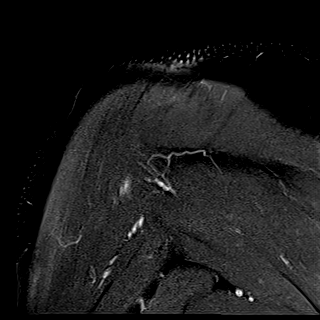
[im 26/26]
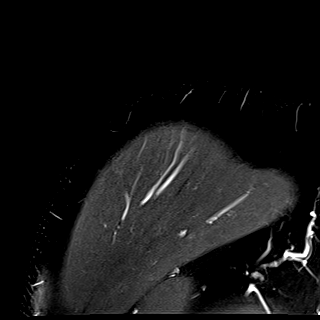

[Series 8: T2 fat-sat · oblique · right · 4.0mm · 0.23mm/px · 7 of 22 slices shown (2 of 2)]
[im 1/22]
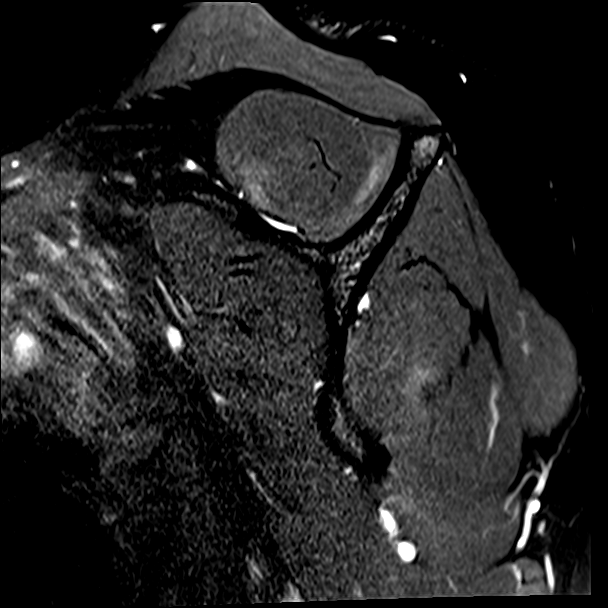
[im 4/22]
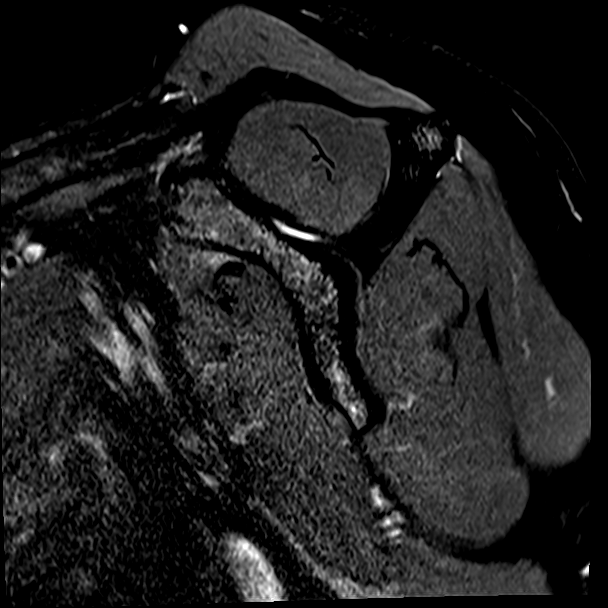
[im 8/22]
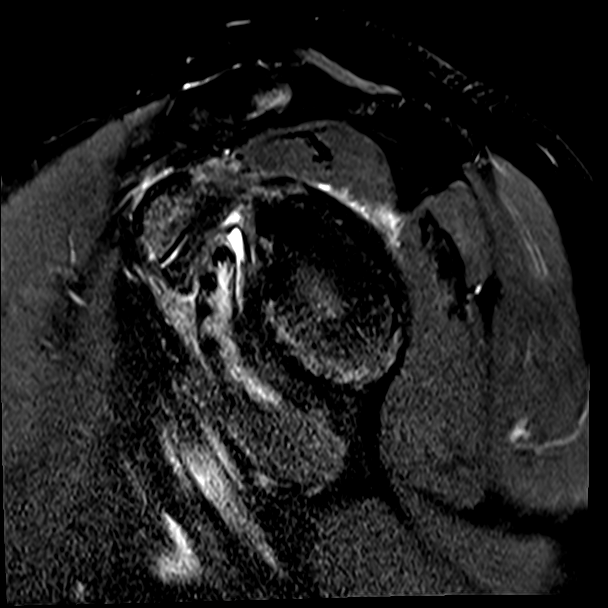
[im 11/22]
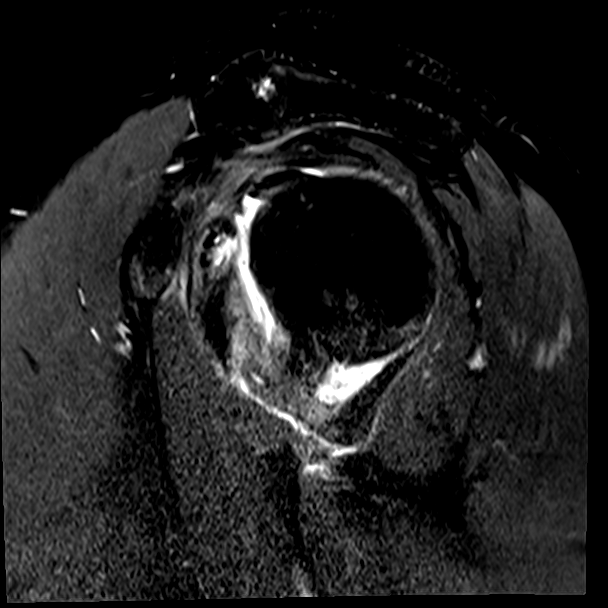
[im 15/22]
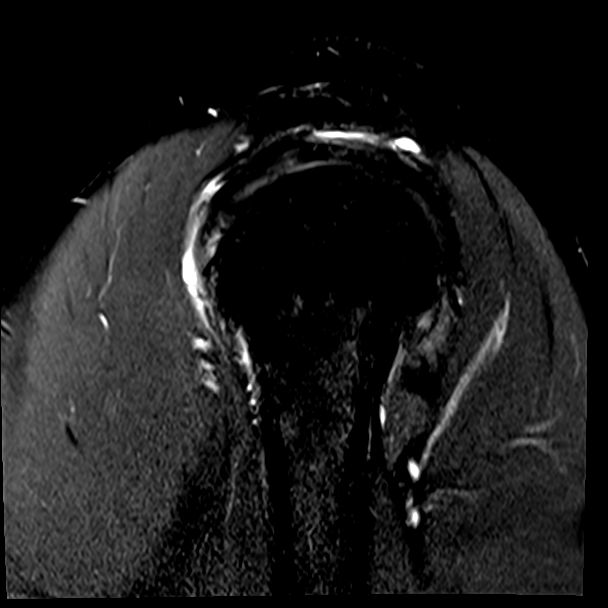
[im 18/22]
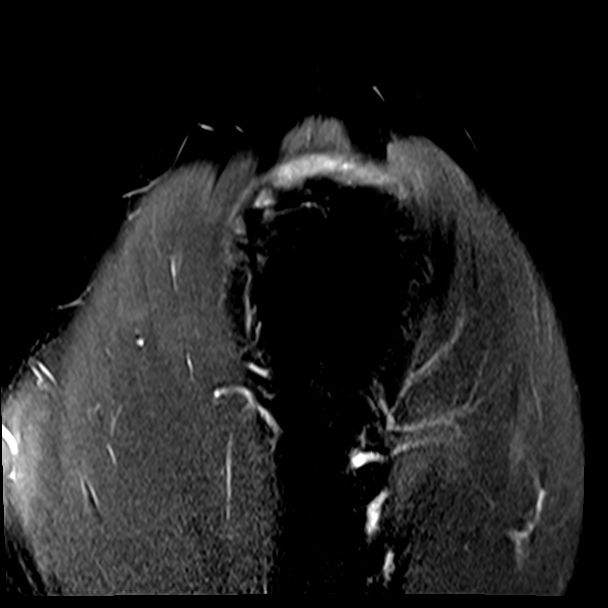
[im 22/22]
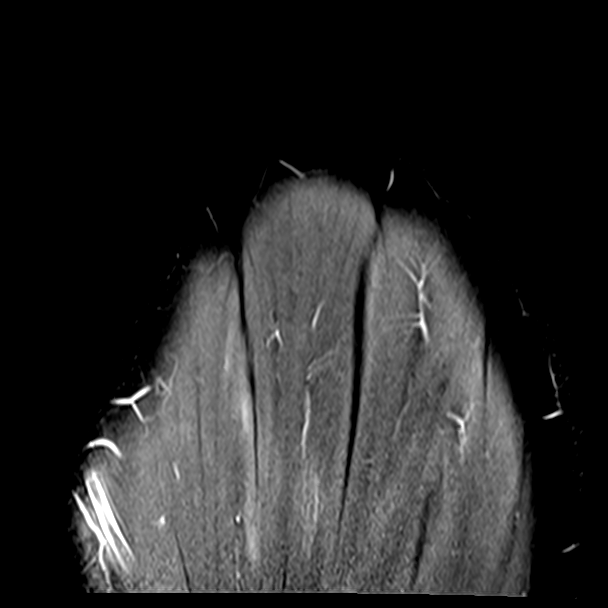

[Series 9: T1 · oblique · right · 4.0mm · 0.36mm/px · 1 of 22 slices shown]
[im 1/22]
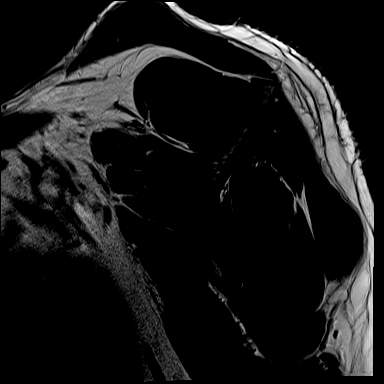

[32 of 40 positions shown; findings below may reference images not displayed]

FINDINGS: Rotator cuff: Intact. Thickening and heterogeneously increased T2
signal consistent with tendinopathy are worst in the subscapularis.

Muscles:  Normal.  No atrophy or focal lesion.

Biceps long head: Intact. Mild tendinopathy of the intra-articular
segment noted.

Acromioclavicular Joint: Moderate osteoarthritis is present. Type 2
acromion. Large subacromial spur is present. There is fluid in the
subacromial/subdeltoid bursa.

Glenohumeral Joint: Mild degenerative change is present. The
inferior glenohumeral ligament is thickened with intermediate
increased T2 signal.

Labrum:  Appears degenerated but intact.

Bones:  No fracture, contusion or focal lesion.

Other: None.
IMPRESSION: Rotator cuff tendinopathy appears most severe in the subscapularis.
No tear is identified. Mild tendinopathy of the intra-articular long
head of biceps without tear is also noted.

Type 2 acromion with a large subacromial spur. Moderate
acromioclavicular osteoarthritis also noted.

Mild glenohumeral osteoarthritis and findings compatible with
adhesive capsulitis.

Small volume of subacromial/subdeltoid fluid compatible with
bursitis.

## 2020-01-30 ENCOUNTER — Other Ambulatory Visit: Payer: Self-pay | Admitting: Urology

## 2020-01-30 ENCOUNTER — Ambulatory Visit (INDEPENDENT_AMBULATORY_CARE_PROVIDER_SITE_OTHER): Payer: BC Managed Care – PPO | Admitting: Urology

## 2020-01-30 ENCOUNTER — Encounter: Payer: Self-pay | Admitting: Urology

## 2020-01-30 ENCOUNTER — Other Ambulatory Visit: Payer: Self-pay

## 2020-01-30 VITALS — BP 120/84 | HR 94 | Ht 76.0 in | Wt 230.0 lb

## 2020-01-30 DIAGNOSIS — R972 Elevated prostate specific antigen [PSA]: Secondary | ICD-10-CM | POA: Diagnosis not present

## 2020-01-30 HISTORY — PX: PROSTATE BIOPSY: SHX241

## 2020-01-30 MED ORDER — LEVOFLOXACIN 500 MG PO TABS
500.0000 mg | ORAL_TABLET | Freq: Once | ORAL | Status: AC
  Administered 2020-01-30: 500 mg via ORAL

## 2020-01-30 MED ORDER — GENTAMICIN SULFATE 40 MG/ML IJ SOLN
80.0000 mg | Freq: Once | INTRAMUSCULAR | Status: AC
  Administered 2020-01-30: 80 mg via INTRAMUSCULAR

## 2020-01-30 NOTE — Patient Instructions (Signed)

## 2020-01-30 NOTE — Progress Notes (Signed)
Prostate Biopsy Procedure   His 06/21 PSA was 6.74 with recheck at 6 and 12.7% free. His 07/18 PSA was 2.3. No h/o BPH. No FH PCa.   He has occasional difficulty urinating. Symptoms better after tamsulosin started. His AUA symptom score is 5. Noc improved. He has a history of erectile dysfunction.   He is a Licensed conveyancer.   Informed consent was obtained after discussing risks/benefits of the procedure.  A time out was performed to ensure correct patient identity.  Pre-Procedure: - Last PSA Level: No results found for: PSA - Gentamicin given prophylactically - Levaquin 500 mg administered PO -Transrectal Ultrasound performed revealing a 71.71 gm prostate -No significant hypoechoic or median lobe noted -DRE: normal   Procedure: - Prostate block performed using 10 cc 1% lidocaine and biopsies taken from sextant areas, a total of 12 under ultrasound guidance.  Post-Procedure: - Patient tolerated the procedure well - He was counseled to seek immediate medical attention if experiences any severe pain, significant bleeding, or fevers - Return in one week to discuss biopsy results

## 2020-02-03 LAB — SURGICAL PATHOLOGY

## 2020-02-05 LAB — PATHOLOGY

## 2020-02-10 NOTE — Progress Notes (Signed)
I called patient and discussed his stage, grade and prognosis.  We went over management of prostate cancer including active surveillance versus treatment with surgery or radiation.  He will consider.  He also ask about treatment of BPH as we discussed his prostate size and we went over the nature risk and benefits of alpha blockers or 5 alpha reductase inhibitors.  He will consider.  He still has some mild hematuria with voiding.

## 2020-02-13 ENCOUNTER — Encounter: Payer: Self-pay | Admitting: Urology

## 2020-02-13 ENCOUNTER — Other Ambulatory Visit: Payer: Self-pay

## 2020-02-13 ENCOUNTER — Ambulatory Visit (INDEPENDENT_AMBULATORY_CARE_PROVIDER_SITE_OTHER): Payer: BC Managed Care – PPO | Admitting: Urology

## 2020-02-13 VITALS — BP 120/79 | HR 103 | Ht 76.0 in | Wt 230.0 lb

## 2020-02-13 DIAGNOSIS — Z5321 Procedure and treatment not carried out due to patient leaving prior to being seen by health care provider: Secondary | ICD-10-CM

## 2020-02-25 ENCOUNTER — Encounter: Payer: Self-pay | Admitting: Urology

## 2020-02-25 ENCOUNTER — Ambulatory Visit (INDEPENDENT_AMBULATORY_CARE_PROVIDER_SITE_OTHER): Payer: BC Managed Care – PPO | Admitting: Urology

## 2020-02-25 ENCOUNTER — Other Ambulatory Visit: Payer: Self-pay

## 2020-02-25 VITALS — BP 108/74 | HR 85 | Ht 76.0 in | Wt 230.0 lb

## 2020-02-25 DIAGNOSIS — N401 Enlarged prostate with lower urinary tract symptoms: Secondary | ICD-10-CM | POA: Diagnosis not present

## 2020-02-25 DIAGNOSIS — N138 Other obstructive and reflux uropathy: Secondary | ICD-10-CM

## 2020-02-25 DIAGNOSIS — R3912 Poor urinary stream: Secondary | ICD-10-CM | POA: Diagnosis not present

## 2020-02-25 DIAGNOSIS — C61 Malignant neoplasm of prostate: Secondary | ICD-10-CM | POA: Diagnosis not present

## 2020-02-25 MED ORDER — FINASTERIDE 5 MG PO TABS
5.0000 mg | ORAL_TABLET | Freq: Every day | ORAL | 3 refills | Status: DC
Start: 2020-02-25 — End: 2020-06-04

## 2020-02-25 NOTE — Patient Instructions (Signed)
Benign Prostatic Hyperplasia  Benign prostatic hyperplasia (BPH) is an enlarged prostate gland that is caused by the normal aging process and not by cancer. The prostate is a walnut-sized gland that is involved in the production of semen. It is located in front of the rectum and below the bladder. The bladder stores urine and the urethra is the tube that carries the urine out of the body. The prostate may get bigger as a man gets older. An enlarged prostate can press on the urethra. This can make it harder to pass urine. The build-up of urine in the bladder can cause infection. Back pressure and infection may progress to bladder damage and kidney (renal) failure. What are the causes? This condition is part of a normal aging process. However, not all men develop problems from this condition. If the prostate enlarges away from the urethra, urine flow will not be blocked. If it enlarges toward the urethra and compresses it, there will be problems passing urine. What increases the risk? This condition is more likely to develop in men over the age of 75 years. What are the signs or symptoms? Symptoms of this condition include:  Getting up often during the night to urinate.  Needing to urinate frequently during the day.  Difficulty starting urine flow.  Decrease in size and strength of your urine stream.  Leaking (dribbling) after urinating.  Inability to pass urine. This needs immediate treatment.  Inability to completely empty your bladder.  Pain when you pass urine. This is more common if there is also an infection.  Urinary tract infection (UTI). How is this diagnosed? This condition is diagnosed based on your medical history, a physical exam, and your symptoms. Tests will also be done, such as:  A post-void bladder scan. This measures any amount of urine that may remain in your bladder after you finish urinating.  A digital rectal exam. In a rectal exam, your health care provider  checks your prostate by putting a lubricated, gloved finger into your rectum to feel the back of your prostate gland. This exam detects the size of your gland and any abnormal lumps or growths.  An exam of your urine (urinalysis).  A prostate specific antigen (PSA) screening. This is a blood test used to screen for prostate cancer.  An ultrasound. This test uses sound waves to electronically produce a picture of your prostate gland. Your health care provider may refer you to a specialist in kidney and prostate diseases (urologist). How is this treated? Once symptoms begin, your health care provider will monitor your condition (active surveillance or watchful waiting). Treatment for this condition will depend on the severity of your condition. Treatment may include:  Observation and yearly exams. This may be the only treatment needed if your condition and symptoms are mild.  Medicines to relieve your symptoms, including: ? Medicines to shrink the prostate. ? Medicines to relax the muscle of the prostate.  Surgery in severe cases. Surgery may include: ? Prostatectomy. In this procedure, the prostate tissue is removed completely through an open incision or with a laparoscope or robotics. ? Transurethral resection of the prostate (TURP). In this procedure, a tool is inserted through the opening at the tip of the penis (urethra). It is used to cut away tissue of the inner core of the prostate. The pieces are removed through the same opening of the penis. This removes the blockage. ? Transurethral incision (TUIP). In this procedure, small cuts are made in the prostate. This lessens  the prostate's pressure on the urethra. ? Transurethral microwave thermotherapy (TUMT). This procedure uses microwaves to create heat. The heat destroys and removes a small amount of prostate tissue. ? Transurethral needle ablation (TUNA). This procedure uses radio frequencies to destroy and remove a small amount of  prostate tissue. ? Interstitial laser coagulation ([place]). This procedure uses a laser to destroy and remove a small amount of prostate tissue. ? Transurethral electrovaporization (TUVP). This procedure uses electrodes to destroy and remove a small amount of prostate tissue. ? Prostatic urethral lift. This procedure inserts an implant to push the lobes of the prostate away from the urethra. Follow these instructions at home:  Take over-the-counter and prescription medicines only as told by your health care provider.  Monitor your symptoms for any changes. Contact your health care provider with any changes.  Avoid drinking large amounts of liquid before going to bed or out in public.  Avoid or reduce how much caffeine or alcohol you drink.  Give yourself time when you urinate.  Keep all follow-up visits as told by your health care provider. This is important. Contact a health care provider if:  You have unexplained back pain.  Your symptoms do not get better with treatment.  You develop side effects from the medicine you are taking.  Your urine becomes very dark or has a bad smell.  Your lower abdomen becomes distended and you have trouble passing your urine. Get help right away if:  You have a fever or chills.  You suddenly cannot urinate.  You feel lightheaded, or very dizzy, or you faint.  There are large amounts of blood or clots in the urine.  Your urinary problems become hard to manage.  You develop moderate to severe low back or flank pain. The flank is the side of your body between the ribs and the hip. These symptoms may represent a serious problem that is an emergency. Do not wait to see if the symptoms will go away. Get medical help right away. Call your local emergency services (911 in the [place] Do not drive yourself to the hospital. Summary  Benign prostatic hyperplasia (BPH) is an enlarged prostate that is caused by the normal aging process and not by  cancer.  An enlarged prostate can press on the urethra. This can make it hard to pass urine.  This condition is part of a normal aging process and is more likely to develop in men over the age of 52 years.  Get help right away if you suddenly cannot urinate. This information is not intended to replace advice given to you by your health care provider. Make sure you discuss any questions you have with your health care provider. Document Revised: 06/04/2018 Document Reviewed: 08/14/2016 Elsevier Patient Education  2020 [place] prostate is a male gland that helps make semen. Prostate cancer is when abnormal cells grow in this gland.  You have a low grade, low stage prostate cancer (Stage T1c, Gleason 3+3=6, PSA 6.7)   Follow these instructions at home:  Take over-the-counter and prescription medicines only as told by your doctor.  Eat a healthy diet.  Get plenty of sleep.  Ask your doctor for help to find a support group for men with prostate cancer.  Keep all follow-up visits as told by your doctor. This is important.  If you have to go to the hospital, let your cancer doctor (oncologist) know.  Touch, hold, hug, and caress your partner to  continue to show sexual feelings. Contact a doctor if:  You have trouble peeing (urinating).  You have blood in your pee (urine).  You have pain in your hips, back, or chest. Get help right away if:  You have weakness in your legs.  You lose feeling (have numbness) in your legs.  You cannot control your pee or your poop (stool).  You have trouble breathing.  You have sudden pain in your chest.  You have chills or a fever. Summary  The prostate is a male gland that helps make semen. Prostate cancer is when abnormal cells grow in this gland.  Ask your doctor for help to find a support group for men with prostate cancer.  Contact a doctor if you have problems peeing or have any new pain that you did  not have before. This information is not intended to replace advice given to you by your health care provider. Make sure you discuss any questions you have with your health care provider. Document Revised: 06/22/2017 Document Reviewed: 03/20/2016 Elsevier Patient Education  2020 [employer].

## 2020-02-25 NOTE — Progress Notes (Signed)
02/25/2020 3:26 PM   Joseph Daugherty August 30, 1960 [id number]  Referring provider: Marion Downer, [address] [place],  [place] [address]  No chief complaint on file.   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 He has occasional difficulty urinating.Symptoms better after tamsulosin started.His AUA symptom score is 5.Noc improved.  3) He has a history of erectile dysfunction.  He is a Licensed conveyancer.  He returns to go over bx results and discuss management of PCa and BPH. He has weak st and noc.    PMH: Past Medical History:  Diagnosis Date  . Diabetes mellitus without complication ([place])   . Hyperlipidemia   . Hypertension     Surgical History: Past Surgical History:  Procedure Laterality Date  . PROSTATE BIOPSY  01/30/2020    Home Medications:  Allergies as of 02/25/2020      Reactions   Penicillins Hives, Anaphylaxis      Medication List       Accurate as of February 25, 2020  3:26 PM. If you have any questions, ask your nurse or doctor.        amLODipine 10 MG tablet Commonly known as: NORVASC Take 1 tablet by mouth daily.   atenolol 50 MG tablet Commonly known as: TENORMIN Take 50 mg by mouth daily.   cyclobenzaprine 5 MG tablet Commonly known as: FLEXERIL Take by mouth.   Farxiga 10 MG Tabs tablet Generic drug: dapagliflozin propanediol Take 10 mg by mouth daily.   Fluticasone-Salmeterol 100-50 MCG/DOSE Aepb Commonly known as: ADVAIR Inhale into the lungs.   ibuprofen 600 MG tablet Commonly known as: ADVIL Take 1 tablet (600 mg total) by mouth every 6 (six) hours as needed.   lidocaine 5 % Commonly known as: Lidoderm Place 1 patch onto the skin every 12 (twelve) hours. Remove & Discard patch within 12 hours or as directed by MD   lisinopril 40 MG tablet Commonly known as: ZESTRIL Take 40  mg by mouth daily.   meloxicam 15 MG tablet Commonly known as: MOBIC Take 15 mg by mouth daily.   rosuvastatin 10 MG tablet Commonly known as: CRESTOR Take 10 mg by mouth daily.   sildenafil 100 MG tablet Commonly known as: VIAGRA TAKE (1) TABLET BY MOUTH 1 HOUR PRIOR TO SEXUAL ACTIVITY. ~MAX OF 1 TAB PER DAY~   sitaGLIPtin-metformin 50-1000 MG tablet Commonly known as: JANUMET Take 1 tablet by mouth 2 (two) times daily with a meal.       Allergies:  Allergies  Allergen Reactions  . Penicillins Hives and Anaphylaxis    Family History: No family history on file.  Social History:  reports that he has been smoking cigarettes. He has been smoking about 0.50 packs per day. He has never used smokeless tobacco. He reports previous drug use. He reports that he does not drink alcohol.   Physical Exam: There were no vitals taken for this visit.  Constitutional:  Alert and oriented, No acute distress. HEENT: [place] AT, moist mucus membranes.  Trachea midline, no masses. Cardiovascular: No clubbing, cyanosis, or edema. Respiratory: Normal respiratory effort, no increased work of breathing. GI: Abdomen is soft, nontender, nondistended, no abdominal masses GU: No CVA tenderness Skin: No rashes, bruises or suspicious lesions. Neurologic: Grossly intact, no focal deficits, moving all 4 extremities.  Psychiatric: Normal mood and affect.  Laboratory Data: Lab Results  Component Value Date   WBC 10.6 (H) 12/13/2019   HGB 14.9 12/13/2019   HCT 43.9 12/13/2019   MCV 85.9 12/13/2019   PLT 264 12/13/2019    Lab Results  Component Value Date   CREATININE 1.04 12/13/2019    No results found for: PSA  No results found for: TESTOSTERONE  No results found for: HGBA1C  Urinalysis    Component Value Date/Time   APPEARANCEUR Clear 01/09/2020 1344   GLUCOSEU 3+ (A) 01/09/2020 1344   BILIRUBINUR Negative 01/09/2020 1344   PROTEINUR Negative 01/09/2020 1344   NITRITE Negative  01/09/2020 1344   LEUKOCYTESUR Negative 01/09/2020 1344    Lab Results  Component Value Date   LABMICR See below: 01/09/2020   WBCUA 11-30 (A) 01/09/2020   LABEPIT 0-10 01/09/2020   BACTERIA None seen 01/09/2020    Pertinent Imaging: n/a No results found for this or any previous visit.  No results found for this or any previous visit.  No results found for this or any previous visit.  No results found for this or any previous visit.  No results found for this or any previous visit.  No results found for this or any previous visit.  No results found for this or any previous visit.  No results found for this or any previous visit.   Assessment & Plan:    PCa -I had a long discussion with [name] using his biopsy report as a guide.  I drew him a picture of the anatomy and we went over his stage, grade and prognosis.  We discussed the nature risks and benefits of active surveillance versus radical prostatectomy versus various radiation techniques such as external beam and brachytherapy.  We discussed treatment effects on bowel, sexual and urinary function among other risk.  All questions answered.  He will start active surveillance.  BPH -again going over the anatomy we discussed the nature risks and benefits of alpha blockers, 5 alpha reductase inhibitors and various procedures such as UroLift, TURP or holmium laser enucleation.  I thought he was a good candidate for laser enucleation, but he is hesitant to undergo any procedures or surgeries.  He was interested in 5 alpha reductase inhibitor in addition to the tamsulosin he is taking and we specifically went over FDA warnings on sexual dysfunction and prostate cancer.  He will start finasteride and continue tamsulosin.  I will see him back in 3 months with a PSA prior.  No follow-ups on file.  Festus Aloe, MD  [facility] [address] [place], [place] [address] [phone]

## 2020-04-26 ENCOUNTER — Other Ambulatory Visit: Payer: Self-pay

## 2020-04-26 ENCOUNTER — Ambulatory Visit (LOCAL_COMMUNITY_HEALTH_CENTER): Payer: Self-pay

## 2020-04-26 DIAGNOSIS — Z111 Encounter for screening for respiratory tuberculosis: Secondary | ICD-10-CM

## 2020-04-26 NOTE — Progress Notes (Signed)
Pt to clinic for TB Screening. See scanned document in media dated 08/20/15: 11/23/14 postive PPD at 10 mm; normal chest x-ray 11/23/14; completed 9 months INH. Today's "Record of Tuberculosis Screening" sent for scanning.

## 2020-05-27 ENCOUNTER — Other Ambulatory Visit: Payer: Self-pay

## 2020-05-27 ENCOUNTER — Other Ambulatory Visit: Payer: BC Managed Care – PPO

## 2020-05-27 DIAGNOSIS — C61 Malignant neoplasm of prostate: Secondary | ICD-10-CM

## 2020-05-28 LAB — PSA: Prostate Specific Ag, Serum: 2.6 ng/mL (ref 0.0–4.0)

## 2020-06-04 ENCOUNTER — Ambulatory Visit (INDEPENDENT_AMBULATORY_CARE_PROVIDER_SITE_OTHER): Payer: BC Managed Care – PPO | Admitting: Urology

## 2020-06-04 ENCOUNTER — Other Ambulatory Visit: Payer: Self-pay

## 2020-06-04 ENCOUNTER — Ambulatory Visit: Payer: BC Managed Care – PPO | Admitting: Urology

## 2020-06-04 ENCOUNTER — Encounter: Payer: Self-pay | Admitting: Urology

## 2020-06-04 VITALS — BP 121/85 | HR 108 | Ht 76.0 in | Wt 228.0 lb

## 2020-06-04 DIAGNOSIS — N400 Enlarged prostate without lower urinary tract symptoms: Secondary | ICD-10-CM

## 2020-06-04 DIAGNOSIS — C61 Malignant neoplasm of prostate: Secondary | ICD-10-CM | POA: Diagnosis not present

## 2020-06-04 DIAGNOSIS — R3912 Poor urinary stream: Secondary | ICD-10-CM | POA: Diagnosis not present

## 2020-06-04 MED ORDER — TAMSULOSIN HCL 0.4 MG PO CAPS: 0.4000 mg | ORAL_CAPSULE | Freq: Every day | ORAL | 3 refills | Status: DC

## 2020-06-04 MED ORDER — FINASTERIDE 5 MG PO TABS: 5.0000 mg | ORAL_TABLET | Freq: Every day | ORAL | 3 refills | Status: DC

## 2020-06-04 NOTE — Progress Notes (Signed)
06/04/2020 1:25 PM   Joseph Daugherty [birth year] [id number]  Referring provider: Marion Downer, [place],  [place] [address]  Chief Complaint  Patient presents with  . 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 He has occasional difficulty urinating.Symptoms better after tamsulosin started.His AUA symptom score is 5.Noc improved.Started finasteride Aug 2021.   3) He has a history of erectile dysfunction.  Jacobs returns and is doing well.  His PSA is decreased to 2.29 May 2020 after starting 5 alpha reductase inhibitor.  This is more than a 50% drop. He is voiding with a good stream which is an improvement. He had some blood in the semen which cleared.   PMH: Past Medical History:  Diagnosis Date  . Diabetes mellitus without complication ([place])   . Hyperlipidemia   . Hypertension     Surgical History: Past Surgical History:  Procedure Laterality Date  . PROSTATE BIOPSY  01/30/2020    Home Medications:  Allergies as of 06/04/2020      Reactions   Penicillins Hives, Anaphylaxis      Medication List       Accurate as of June 04, 2020  1:25 PM. If you have any questions, ask your nurse or doctor.        amLODipine 10 MG tablet Commonly known as: NORVASC Take 1 tablet by mouth daily.   atenolol 50 MG tablet Commonly known as: TENORMIN Take 50 mg by mouth daily.   cyclobenzaprine 5 MG tablet Commonly known as: FLEXERIL Take by mouth.   Farxiga 10 MG Tabs tablet Generic drug: dapagliflozin propanediol Take 10 mg by mouth daily.   finasteride 5 MG tablet Commonly known as: Proscar Take 1 tablet (5 mg total) by mouth daily.   Fluticasone-Salmeterol 100-50 MCG/DOSE Aepb Commonly known as: ADVAIR Inhale into the lungs.   glipiZIDE 5 MG 24 hr tablet Commonly known as:  GLUCOTROL XL Take 5 mg by mouth daily.   ibuprofen 600 MG tablet Commonly known as: ADVIL Take 1 tablet (600 mg total) by mouth every 6 (six) hours as needed.   lidocaine 5 % Commonly known as: Lidoderm Place 1 patch onto the skin every 12 (twelve) hours. Remove & Discard patch within 12 hours or as directed by MD   lisinopril 40 MG tablet Commonly known as: ZESTRIL Take 40 mg by mouth daily.   lisinopril-hydrochlorothiazide 20-12.5 MG tablet Commonly known as: ZESTORETIC Take 1 tablet by mouth daily.   meloxicam 15 MG tablet Commonly known as: MOBIC Take 15 mg by mouth daily.   metFORMIN 1000 MG tablet Commonly known as: GLUCOPHAGE Take 1,000 mg by mouth 2 (two) times daily.   rosuvastatin 10 MG tablet Commonly known as: CRESTOR Take 10 mg by mouth daily.   sildenafil 100 MG tablet Commonly known as: VIAGRA TAKE (1) TABLET BY MOUTH 1 HOUR PRIOR TO SEXUAL ACTIVITY. ~MAX OF 1 TAB PER DAY~   sitaGLIPtin-metformin 50-1000 MG tablet Commonly known as: JANUMET Take 1 tablet by mouth 2 (two) times daily with a meal.   tamsulosin 0.4 MG Caps capsule Commonly known as: FLOMAX Take 0.4 mg by mouth daily.       Allergies:  Allergies  Allergen Reactions  . Penicillins Hives and Anaphylaxis    Family History: No family  history on file.  Social History:  reports that he has been smoking cigarettes. He has been smoking about 0.50 packs per day. He has never used smokeless tobacco. He reports previous drug use. He reports that he does not drink alcohol.   Physical Exam: BP 121/85 (BP Location: Left Arm, Patient Position: Sitting, Cuff Size: Large)   Pulse (!) 108   Ht 6\' 4"  (1.93 m)   Wt 228 lb (103.4 kg)   BMI 27.75 kg/m   Constitutional:  Alert and oriented, No acute distress. HEENT: [place] AT, moist mucus membranes.  Trachea midline, no masses. Cardiovascular: No clubbing, cyanosis, or edema. Respiratory: Normal respiratory effort, no increased work of  breathing. GI: Abdomen is soft, nontender, nondistended, no abdominal masses GU: No CVA tenderness Skin: No rashes, bruises or suspicious lesions. Neurologic: Grossly intact, no focal deficits, moving all 4 extremities. Psychiatric: Normal mood and affect.  Laboratory Data: Lab Results  Component Value Date   WBC 10.6 (H) 12/13/2019   HGB 14.9 12/13/2019   HCT 43.9 12/13/2019   MCV 85.9 12/13/2019   PLT 264 12/13/2019    Lab Results  Component Value Date   CREATININE 1.04 12/13/2019    No results found for: PSA  No results found for: TESTOSTERONE  No results found for: HGBA1C  Urinalysis    Component Value Date/Time   APPEARANCEUR Clear 01/09/2020 1344   GLUCOSEU 3+ (A) 01/09/2020 1344   BILIRUBINUR Negative 01/09/2020 1344   PROTEINUR Negative 01/09/2020 1344   NITRITE Negative 01/09/2020 1344   LEUKOCYTESUR Negative 01/09/2020 1344    Lab Results  Component Value Date   LABMICR See below: 01/09/2020   WBCUA 11-30 (A) 01/09/2020   LABEPIT 0-10 01/09/2020   BACTERIA None seen 01/09/2020    Pertinent Imaging: N/a  No results found for this or any previous visit.  No results found for this or any previous visit.  No results found for this or any previous visit.  No results found for this or any previous visit.  No results found for this or any previous visit.  No results found for this or any previous visit.  No results found for this or any previous visit.  No results found for this or any previous visit.   Assessment & Plan:    PCa - disc PSA levels have decreased appropriately. Disc AS vs treatment. He will continue AS.   BPH - cont tams and finasteride.   No follow-ups on file.  Festus Aloe, MD  [facility] [address] [place], [place] [address] [phone]

## 2020-08-31 ENCOUNTER — Emergency Department
Admission: EM | Admit: 2020-08-31 | Discharge: 2020-08-31 | Disposition: A | Discharge status: Home / Self Care | Payer: BC Managed Care – PPO | Attending: Emergency Medicine | Admitting: Emergency Medicine

## 2020-08-31 ENCOUNTER — Emergency Department: Payer: BC Managed Care – PPO

## 2020-08-31 ENCOUNTER — Encounter: Payer: Self-pay | Admitting: Emergency Medicine

## 2020-08-31 ENCOUNTER — Other Ambulatory Visit: Payer: Self-pay

## 2020-08-31 DIAGNOSIS — Z7984 Long term (current) use of oral hypoglycemic drugs: Secondary | ICD-10-CM | POA: Insufficient documentation

## 2020-08-31 DIAGNOSIS — Z79899 Other long term (current) drug therapy: Secondary | ICD-10-CM | POA: Diagnosis not present

## 2020-08-31 DIAGNOSIS — K859 Acute pancreatitis without necrosis or infection, unspecified: Secondary | ICD-10-CM | POA: Diagnosis not present

## 2020-08-31 DIAGNOSIS — F17210 Nicotine dependence, cigarettes, uncomplicated: Secondary | ICD-10-CM | POA: Insufficient documentation

## 2020-08-31 DIAGNOSIS — I10 Essential (primary) hypertension: Secondary | ICD-10-CM | POA: Diagnosis not present

## 2020-08-31 DIAGNOSIS — E119 Type 2 diabetes mellitus without complications: Secondary | ICD-10-CM | POA: Diagnosis not present

## 2020-08-31 DIAGNOSIS — R1013 Epigastric pain: Secondary | ICD-10-CM | POA: Diagnosis present

## 2020-08-31 LAB — COMPREHENSIVE METABOLIC PANEL
ALT: 26 U/L (ref 0–44)
AST: 18 U/L (ref 15–41)
Albumin: 4.4 g/dL (ref 3.5–5.0)
Alkaline Phosphatase: 53 U/L (ref 38–126)
Anion gap: 10 (ref 5–15)
BUN: 12 mg/dL (ref 6–20)
CO2: 28 mmol/L (ref 22–32)
Calcium: 9.2 mg/dL (ref 8.9–10.3)
Chloride: 97 mmol/L — ABNORMAL LOW (ref 98–111)
Creatinine, Ser: 1.04 mg/dL (ref 0.61–1.24)
GFR, Estimated: >60 mL/min (ref >60)
Glucose, Bld: 306 mg/dL — ABNORMAL HIGH (ref 70–99)
Potassium: 3.8 mmol/L (ref 3.5–5.1)
Sodium: 135 mmol/L (ref 135–145)
Total Bilirubin: 0.6 mg/dL (ref 0.3–1.2)
Total Protein: 7.3 g/dL (ref 6.5–8.1)

## 2020-08-31 LAB — URINALYSIS, COMPLETE (UACMP) WITH MICROSCOPIC
Bacteria, UA: NONE SEEN
Bilirubin Urine: NEGATIVE
Glucose, UA: >=500 mg/dL — AB
Ketones, ur: NEGATIVE mg/dL
Leukocytes,Ua: NEGATIVE
Nitrite: NEGATIVE
Protein, ur: NEGATIVE mg/dL
Specific Gravity, Urine: 1.014 (ref 1.005–1.030)
Squamous Epithelial / HPF: NONE SEEN (ref 0–5)
pH: 5 (ref 5.0–8.0)

## 2020-08-31 LAB — CBC
HCT: 41.5 % (ref 39.0–52.0)
Hemoglobin: 14.2 g/dL (ref 13.0–17.0)
MCH: 29.1 pg (ref 26.0–34.0)
MCHC: 34.2 g/dL (ref 30.0–36.0)
MCV: 85 fL (ref 80.0–100.0)
Platelets: 279 10*3/uL (ref 150–400)
RBC: 4.88 MIL/uL (ref 4.22–5.81)
RDW: 12.4 % (ref 11.5–15.5)
WBC: 11.4 10*3/uL — ABNORMAL HIGH (ref 4.0–10.5)
nRBC: 0 % (ref 0.0–0.2)

## 2020-08-31 LAB — LIPASE, BLOOD: Lipase: 62 U/L — ABNORMAL HIGH (ref 11–51)

## 2020-08-31 IMAGING — US US ABDOMEN LIMITED RUQ/ASCITES
1 series · 14 of 25 positions shown · non-contrast
Comparison: None.

CLINICAL DATA: Epigastric pain since last night.

EXAM:
ULTRASOUND ABDOMEN LIMITED RIGHT UPPER QUADRANT

[Series 1: us abdomen limited ruq (liver/gb) · 14 of 54 slices shown]
[im 1/54]
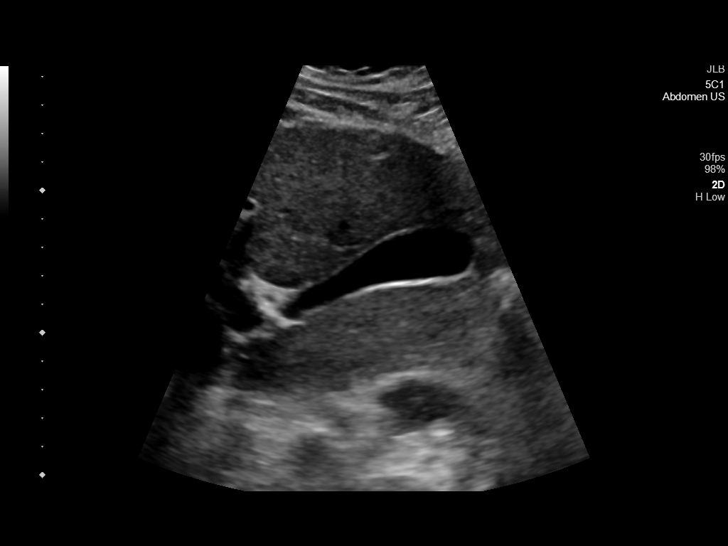
[im 5/54]
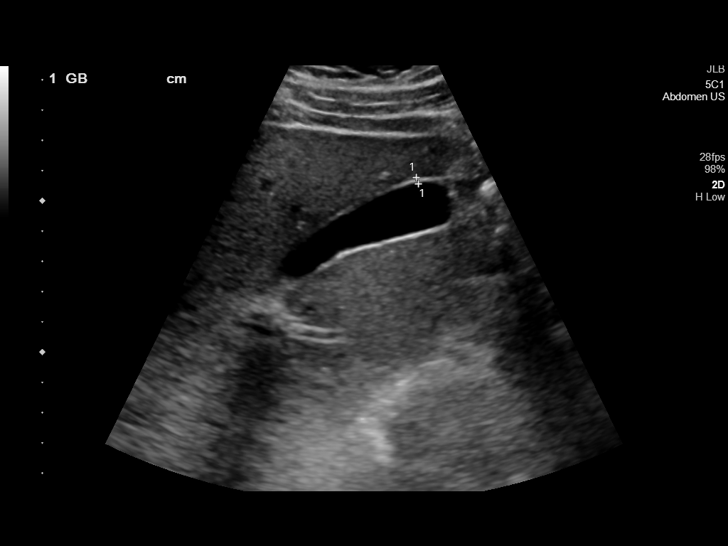
[im 9/54]
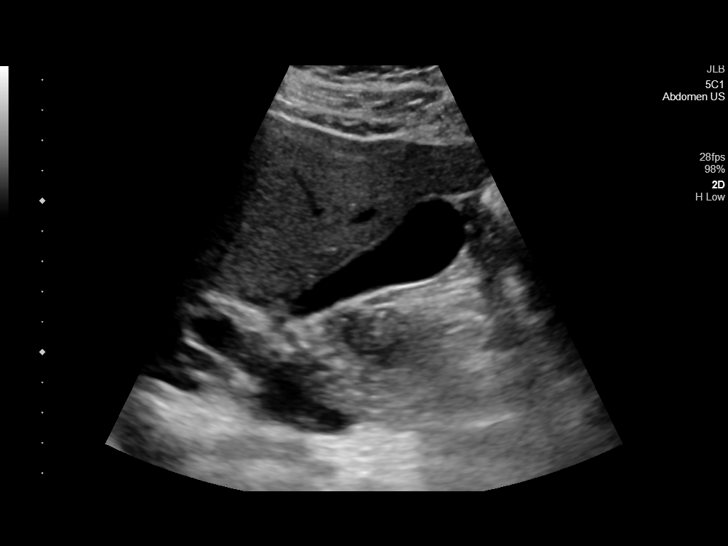
[im 14/54]
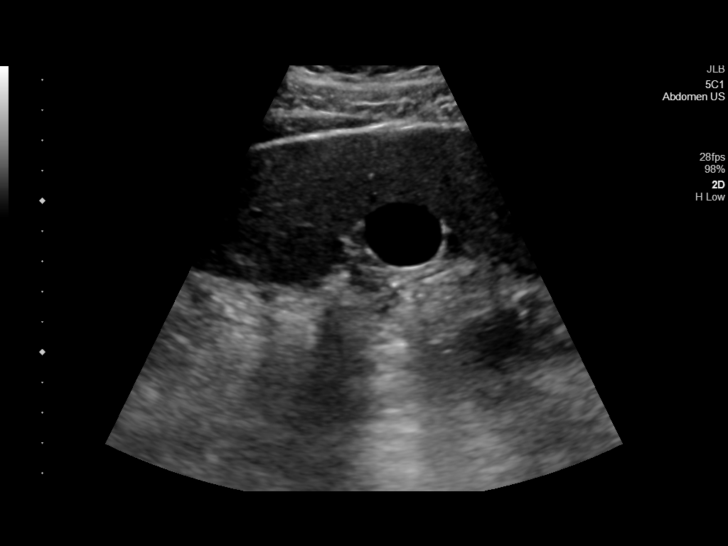
[im 18/54]
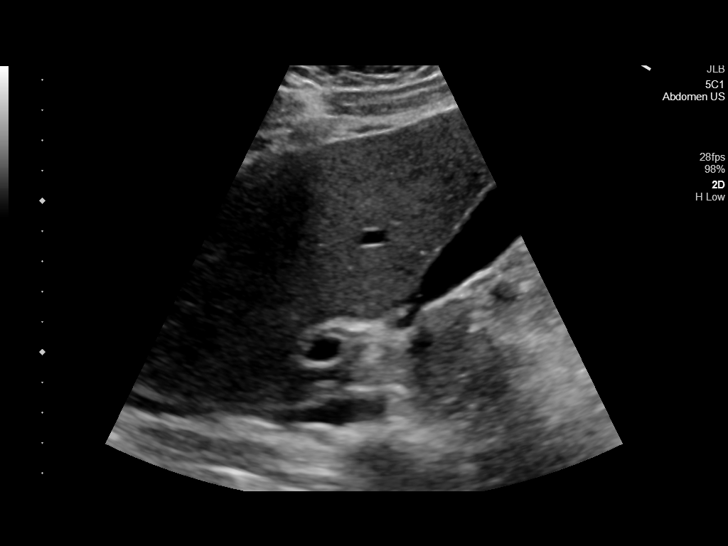
[im 20/54]
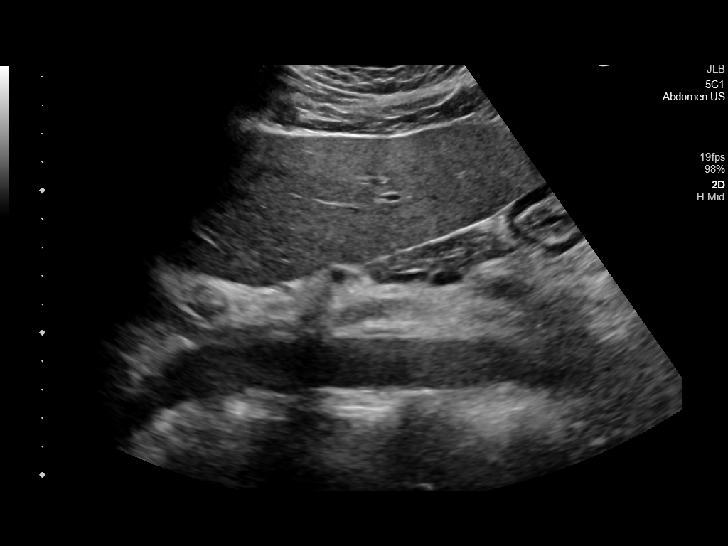
[im 25/54]
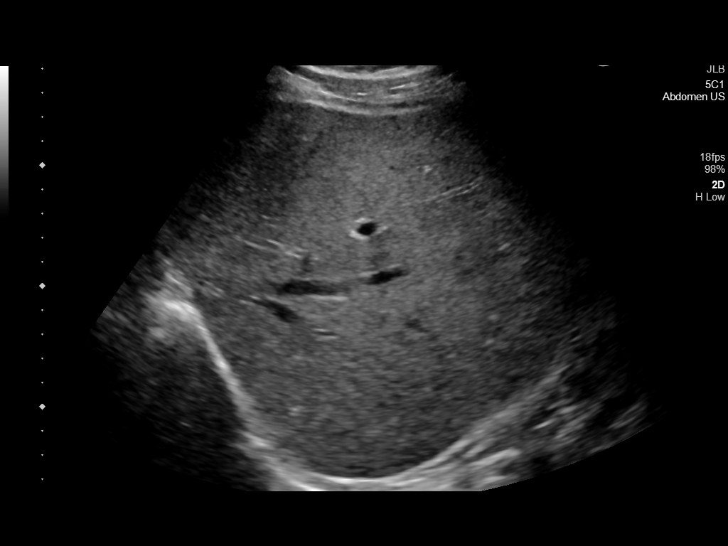
[im 29/54]
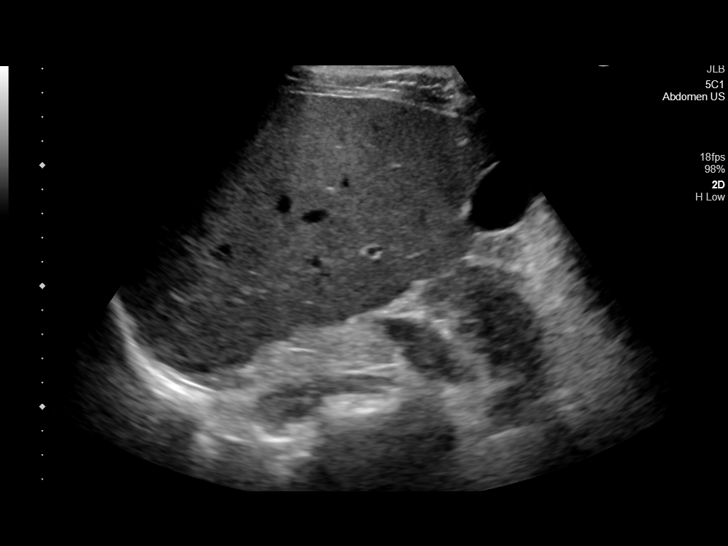
[im 34/54]
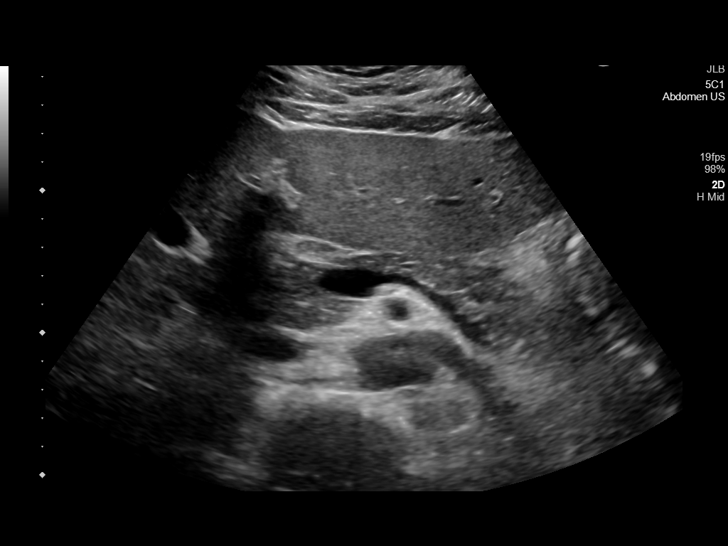
[im 36/54]
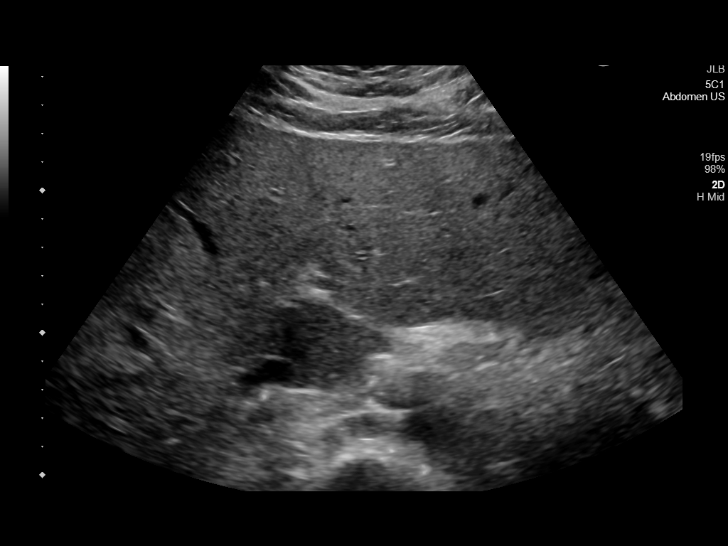
[im 40/54]
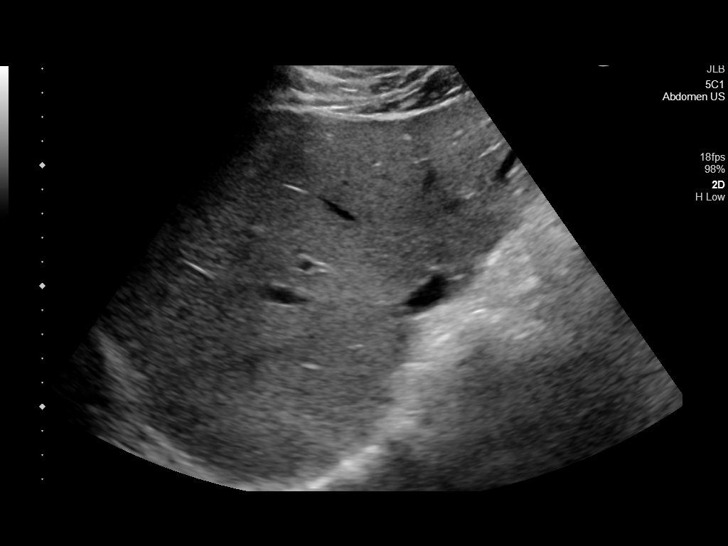
[im 45/54]
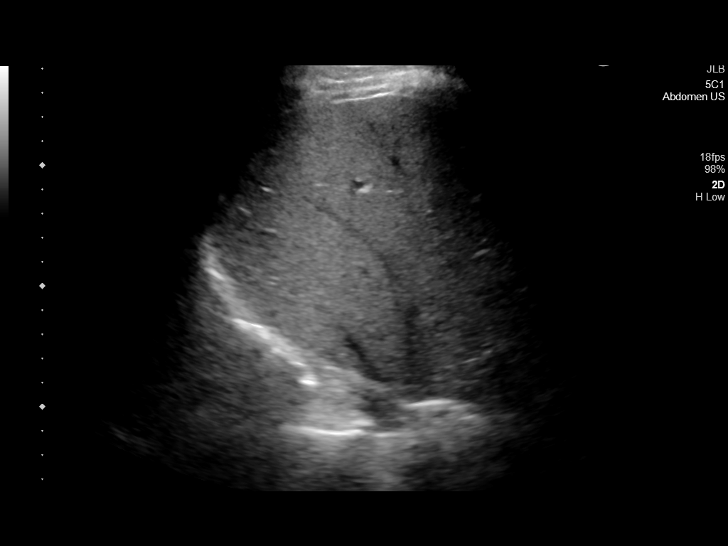
[im 49/54]
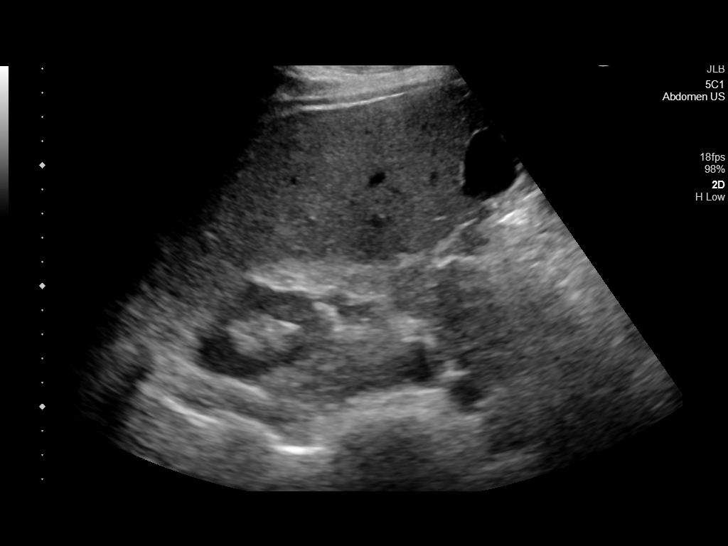
[im 54/54]
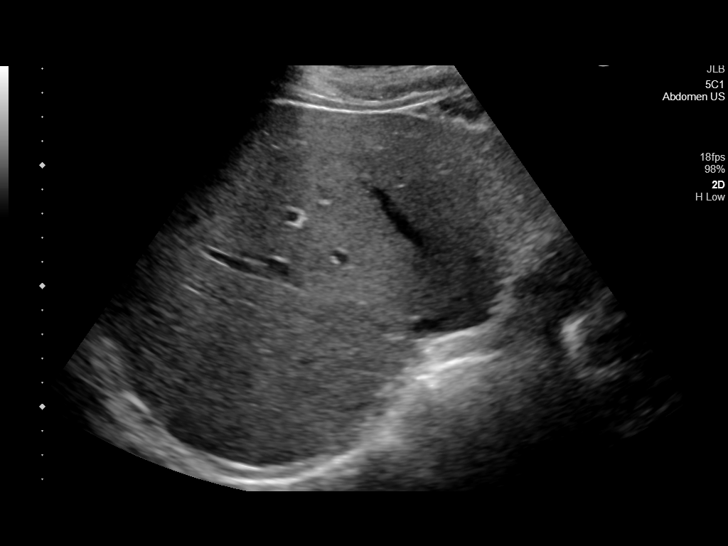

[14 of 25 positions shown; findings below may reference images not displayed]

FINDINGS: Gallbladder:

No gallstones or wall thickening visualized. No sonographic Murphy
sign noted by sonographer.

Common bile duct:

Diameter: 2 mm

Liver:

No focal lesion identified. Within normal limits in parenchymal
echogenicity. Portal vein is patent on color Doppler imaging with
normal direction of blood flow towards the liver.

Other: None.
IMPRESSION: Normal right upper quadrant abdominal ultrasound.

## 2020-08-31 MED ORDER — MORPHINE SULFATE (PF) 4 MG/ML IV SOLN
4.0000 mg | Freq: Once | INTRAVENOUS | Status: AC
  Administered 2020-08-31: 4 mg via INTRAVENOUS
  Filled 2020-08-31: qty 1

## 2020-08-31 MED ORDER — SODIUM CHLORIDE 0.9 % IV BOLUS
1000.0000 mL | Freq: Once | INTRAVENOUS | Status: AC
  Administered 2020-08-31: 1000 mL via INTRAVENOUS

## 2020-08-31 MED ORDER — ONDANSETRON 8 MG PO TBDP
8.0000 mg | ORAL_TABLET | Freq: Three times a day (TID) | ORAL | 0 refills | Status: DC | PRN
Start: 2020-08-31 — End: 2022-01-23

## 2020-08-31 MED ORDER — HYDROCODONE-ACETAMINOPHEN 5-325 MG PO TABS: 1.0000 | ORAL_TABLET | ORAL | 0 refills | Status: AC | PRN

## 2020-08-31 MED ORDER — ONDANSETRON HCL 4 MG/2ML IJ SOLN
4.0000 mg | Freq: Once | INTRAMUSCULAR | Status: AC
  Administered 2020-08-31: 4 mg via INTRAVENOUS
  Filled 2020-08-31: qty 2

## 2020-08-31 NOTE — ED Notes (Signed)
US at bedside

## 2020-08-31 NOTE — ED Triage Notes (Signed)
Pt in w/sharp RUQ pain, worsening x 3 days. States he could not sleep last night d/t pain. Emesis x 1 PTA, pain radiates to back, denies any urinary symptoms or fevers. Still has gallbladder

## 2020-08-31 NOTE — Discharge Instructions (Signed)
Take the hydrocodone as needed for pain, and the Zofran (ondansetron) as needed for nausea.  You should drink only clear liquids for least the next day and then gradually advance your diet, eating bland foods, small meals frequently throughout the day (rather than large meals) and avoiding any fatty or spicy foods.  Return to the ER for new, worsening, or persistent severe abdominal pain, vomiting, fever, weakness, or any other new or worsening symptoms that concern you.

## 2020-08-31 NOTE — ED Provider Notes (Signed)
[facility] Emergency Department Provider Note ____________________________________________   Event Date/Time   First MD Initiated Contact with Patient 08/31/20 1004     (approximate)  I have reviewed the triage vital signs and the nursing notes.   HISTORY  Chief Complaint Abdominal Pain (RUQ)    HPI Joseph Daugherty is a 60 y.o. male with PMH as noted below who presents with epigastric pain over the last 2 days, persistent course, associated with nausea and one episode of vomiting this morning.  He states it feels like an episode of pancreatitis that he had about 15 years ago but is less severe.  He has no associated diarrhea, shortness of breath, or fever.  The patient reports a prior history of alcohol abuse but states that he has not been drinking about 15 years.  He has had no intra-abdominal surgeries in the past.  Past Medical History:  Diagnosis Date  . Diabetes mellitus without complication ([place])   . Hyperlipidemia   . Hypertension     Patient Active Problem List   Diagnosis Date Noted  . Acute pain of right shoulder 01/07/2020  . Allergic rhinitis 05/26/2019  . ED (erectile dysfunction) of organic origin 05/26/2019  . BPH (benign prostatic hyperplasia) 10/21/2018  . Chronic obstructive pulmonary disease ([place]) 01/15/2018  . Nicotine dependence 01/15/2018  . Abnormal EKG 04/11/2017  . Type 2 diabetes mellitus without complication, without long-term current use of insulin ([place]) 04/11/2017  . CAP (community acquired pneumonia) 10/20/2010  . Current smoker 10/20/2010  . 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 HLD (hyperlipidemia) 09/16/2010  . Hypertension, benign 09/16/2010  . Pancreatitis 09/16/2010    Past Surgical History:  Procedure Laterality Date  . PROSTATE BIOPSY  01/30/2020    Prior to Admission medications   Medication Sig Start Date End Date Taking? Authorizing Provider  HYDROcodone-acetaminophen (NORCO/VICODIN) 5-325 MG tablet  Take 1 tablet by mouth every 4 (four) hours as needed for up to 5 days for moderate pain. 08/31/20 09/05/20 Yes Arta Silence, MD  ondansetron (ZOFRAN ODT) 8 MG disintegrating tablet Take 1 tablet (8 mg total) by mouth every 8 (eight) hours as needed for nausea or vomiting. 08/31/20  Yes Arta Silence, MD  amLODipine (NORVASC) 10 MG tablet Take 1 tablet by mouth daily. 01/07/20   [provider]  atenolol (TENORMIN) 50 MG tablet Take 50 mg by mouth daily.    [provider]  cyclobenzaprine (FLEXERIL) 5 MG tablet Take by mouth. 01/07/20   [provider]  dapagliflozin propanediol (FARXIGA) 10 MG TABS tablet Take 10 mg by mouth daily. Patient not taking: Reported on 06/04/2020    [provider]  finasteride (PROSCAR) 5 MG tablet Take 1 tablet (5 mg total) by mouth daily. 06/04/20   Festus Aloe, MD  Fluticasone-Salmeterol (ADVAIR) 100-50 MCG/DOSE AEPB Inhale into the lungs. 01/07/20   [provider]  glipiZIDE (GLUCOTROL XL) 5 MG 24 hr tablet Take 5 mg by mouth daily. 05/28/20   [provider]  ibuprofen (ADVIL,MOTRIN) 600 MG tablet Take 1 tablet (600 mg total) by mouth every 6 (six) hours as needed. 09/07/18   Arta Silence, MD  lidocaine (LIDODERM) 5 % Place 1 patch onto the skin every 12 (twelve) hours. Remove & Discard patch within 12 hours or as directed by MD 12/13/19 12/12/20  Sable Feil, PA-C  lisinopril (PRINIVIL,ZESTRIL) 40 MG tablet Take 40 mg by mouth daily.    [provider]  lisinopril-hydrochlorothiazide (ZESTORETIC) 20-12.5 MG tablet Take 1 tablet  by mouth daily. 05/28/20   [provider]  meloxicam (MOBIC) 15 MG tablet Take 15 mg by mouth daily.    [provider]  metFORMIN (GLUCOPHAGE) 1000 MG tablet Take 1,000 mg by mouth 2 (two) times daily. 05/28/20   [provider]  rosuvastatin (CRESTOR) 10 MG tablet Take 10 mg by mouth daily.    [provider]  sildenafil  (VIAGRA) 100 MG tablet TAKE (1) TABLET BY MOUTH 1 HOUR PRIOR TO SEXUAL ACTIVITY. ~MAX OF 1 TAB PER DAY~ 12/10/19   [provider]  sitaGLIPtin-metformin (JANUMET) 50-1000 MG tablet Take 1 tablet by mouth 2 (two) times daily with a meal. Patient not taking: Reported on 06/04/2020    [provider]  tamsulosin (FLOMAX) 0.4 MG CAPS capsule Take 1 capsule (0.4 mg total) by mouth daily. 06/04/20   Festus Aloe, MD    Allergies Penicillins  No family history on file.  Social History Social History   Tobacco Use  . Smoking status: Current Every Day Smoker    Packs/day: 0.25    Types: Cigarettes  . Smokeless tobacco: Never Used  Substance Use Topics  . Alcohol use: Never  . Drug use: Not Currently    Review of Systems  Constitutional: No fever/chills Eyes: No visual changes. ENT: No sore throat. Cardiovascular: Denies chest pain. Respiratory: Denies shortness of breath. Gastrointestinal: Positive for nausea and vomiting. Genitourinary: Negative for dysuria.  Musculoskeletal: Negative for back pain. Skin: Negative for rash. Neurological: Negative for headaches, focal weakness or numbness.   ____________________________________________   PHYSICAL EXAM:  VITAL SIGNS: ED Triage Vitals [08/31/20 0757]  Enc Vitals Group     BP (!) 126/105     Pulse Rate (!) 102     Resp 18     Temp 97.8 F (36.6 C)     Temp Source Oral     SpO2 99 %     Weight      Height      Head Circumference      Peak Flow      Pain Score      Pain Loc      Pain Edu?      Excl. in [place]?     Constitutional: Alert and oriented.  Slightly uncomfortable appearing but in no acute distress. Eyes: Conjunctivae are normal.  No scleral icterus. Head: Atraumatic. Nose: No congestion/rhinnorhea. Mouth/Throat: Mucous membranes are moist.   Neck: Normal range of motion.  Cardiovascular: Normal rate, regular rhythm.  Good peripheral circulation. Respiratory: Normal respiratory  effort.  No retractions.  Gastrointestinal: Soft with mild epigastric and right upper quadrant tenderness.  No distention.  Genitourinary: No flank tenderness. Musculoskeletal: Extremities warm and well perfused.  Neurologic:  Normal speech and language. No gross focal neurologic deficits are appreciated.  Skin:  Skin is warm and dry. No rash noted. Psychiatric: Mood and affect are normal. Speech and behavior are normal.  ____________________________________________   LABS (all labs ordered are listed, but only abnormal results are displayed)  Labs Reviewed  LIPASE, BLOOD - Abnormal; Notable for the following components:      Result Value   Lipase 62 (*)    All other components within normal limits  COMPREHENSIVE METABOLIC PANEL - Abnormal; Notable for the following components:   Chloride 97 (*)    Glucose, Bld 306 (*)    All other components within normal limits  CBC - Abnormal; Notable for the following components:   WBC 11.4 (*)    All  other components within normal limits  URINALYSIS, COMPLETE (UACMP) WITH MICROSCOPIC - Abnormal; Notable for the following components:   Color, Urine YELLOW (*)    APPearance CLEAR (*)    Glucose, UA >=500 (*)    Hgb urine dipstick SMALL (*)    All other components within normal limits   ____________________________________________  EKG   ____________________________________________  RADIOLOGY  US abdomen RUQ: No gallstones or other acute abnormality  ____________________________________________   PROCEDURES  Procedure(s) performed: No  Procedures  Critical Care performed: No ____________________________________________   INITIAL IMPRESSION / ASSESSMENT AND PLAN / ED COURSE  Pertinent labs & imaging results that were available during my care of the patient were reviewed by me and considered in my medical decision making (see chart for details).  60 year old male with PMH as noted above including diabetes and a prior  remote episode of pancreatitis related alcohol abuse presents with epigastric abdominal pain along with nausea over the last several days.  He ha  vomited once today.  He states that the pain is somewhat similar to but less severe than what he remembers from prior pancreatitis.  On exam, the patient is overall well-appearing.  His vital signs are normal.  The abdomen is soft with mild epigastric and right upper quadrant tenderness.  Initial lab work-up reveals a slightly elevated lipase and WBC count but is otherwise reassuring.  LFTs and bilirubin are normal.  Differential includes mild pancreatitis, cholelithiasis, cholecystitis, other hepatobiliary cause, gastritis, gastroenteritis.  We will obtain a right upper quadrant ultrasound for further evaluation.  ----------------------------------------- 1:16 PM on 08/31/2020 -----------------------------------------  Ultrasound shows no acute abnormalities.  The patient reports significant improvement in his symptoms with Zofran, morphine, and fluids.  Overall presentation is most consistent with mild acute pancreatitis.  There is no evidence of biliary obstruction.  Given that the patient is overall well-appearing, the lipase is not significantly elevated, and he is tolerating p.o., he is appropriate for discharge home and agrees with this plan.  I recommended a clear liquid diet, transitioning to a bland diet, and gave the patient very thorough return precautions.  He expressed understanding.  I have prescribed hydrocodone and Zofran for symptomatic treatment.  ____________________________________________   FINAL CLINICAL IMPRESSION(S) / ED DIAGNOSES  Final diagnoses:  Epigastric abdominal pain  Acute pancreatitis without infection or necrosis, unspecified pancreatitis type      NEW MEDICATIONS STARTED DURING THIS VISIT:  New Prescriptions   HYDROCODONE-ACETAMINOPHEN (NORCO/VICODIN) 5-325 MG TABLET    Take 1 tablet by mouth every 4  (four) hours as needed for up to 5 days for moderate pain.   ONDANSETRON (ZOFRAN ODT) 8 MG DISINTEGRATING TABLET    Take 1 tablet (8 mg total) by mouth every 8 (eight) hours as needed for nausea or vomiting.     Note:  This document was prepared using Dragon voice recognition software and may include unintentional dictation errors.    Arta Silence, MD 08/31/20 [phone]

## 2020-09-09 IMAGING — CR DG SHOULDER 2+V*R*
1 series · 3 of 3 positions shown · non-contrast
Comparison: None.

CLINICAL DATA: Shoulder pain, no known injury

EXAM:
RIGHT SHOULDER - 2+ VIEW

[Series 1: dg shoulder right · 0.14mm/px · 3 of 3 slices shown]
[im 1/3]
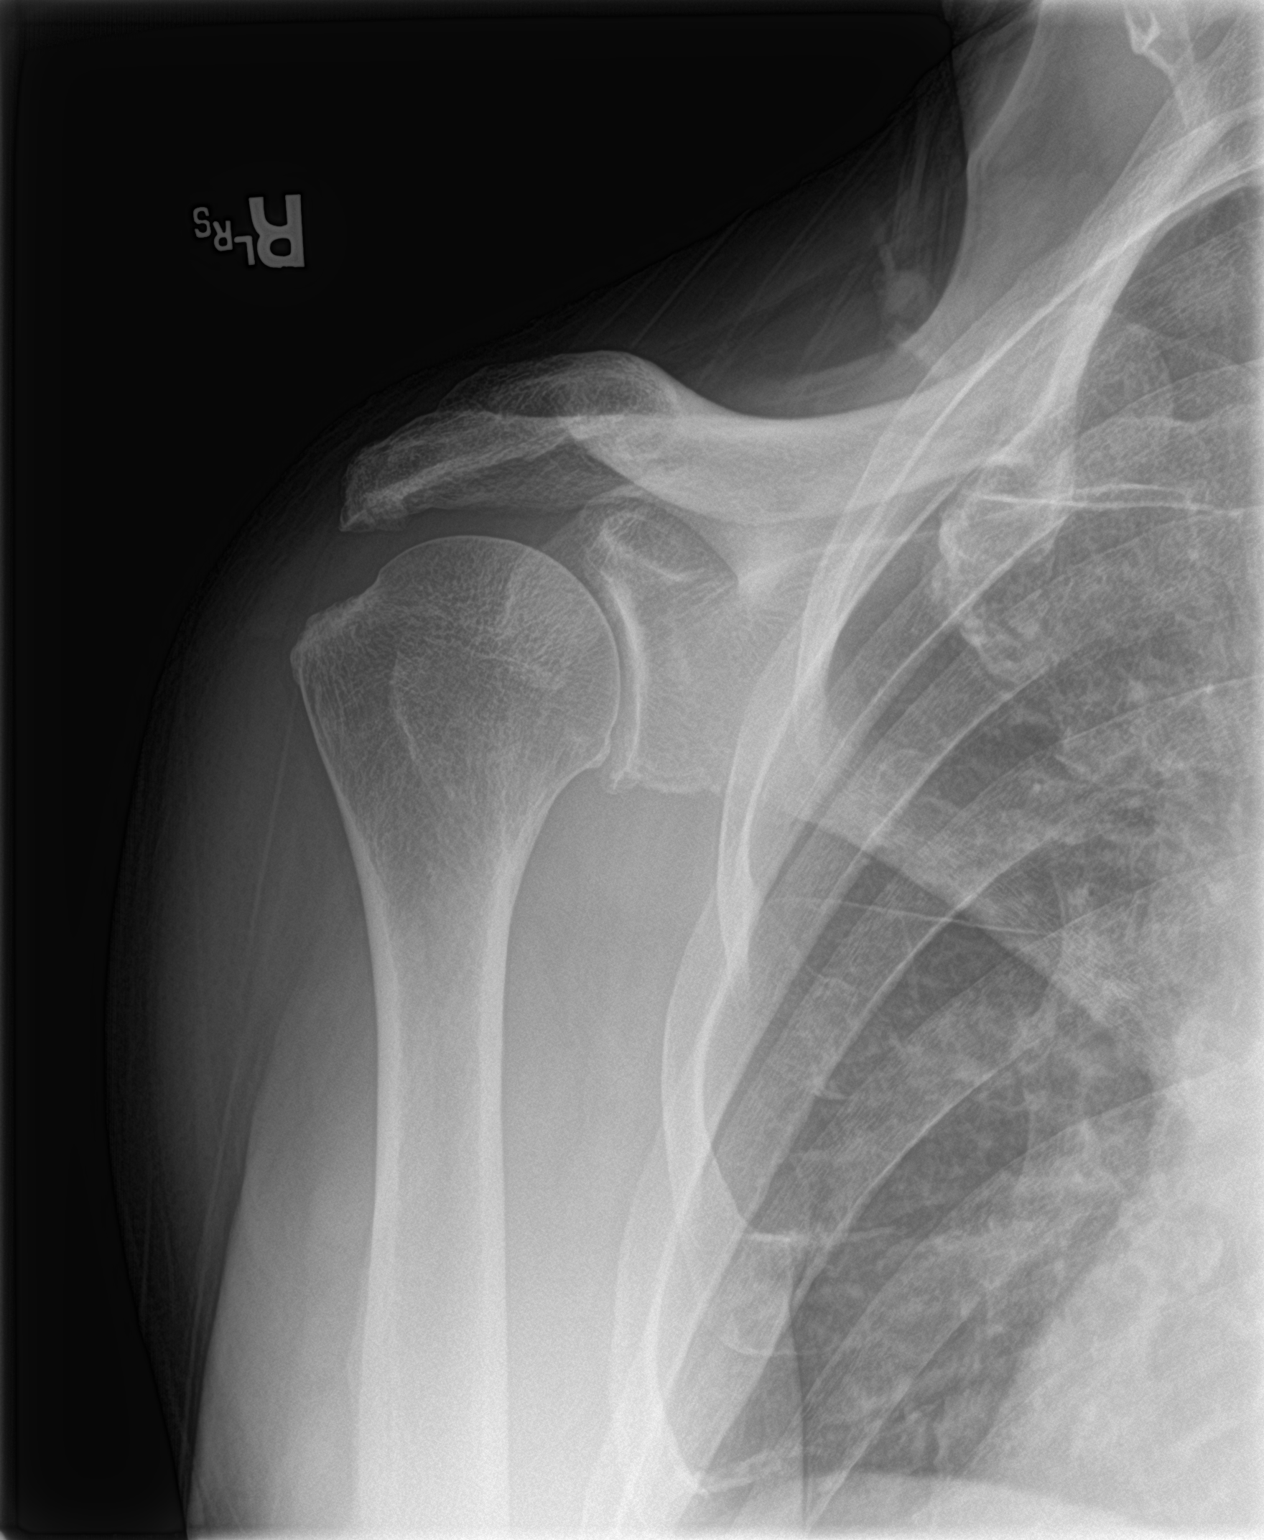
[im 2/3]
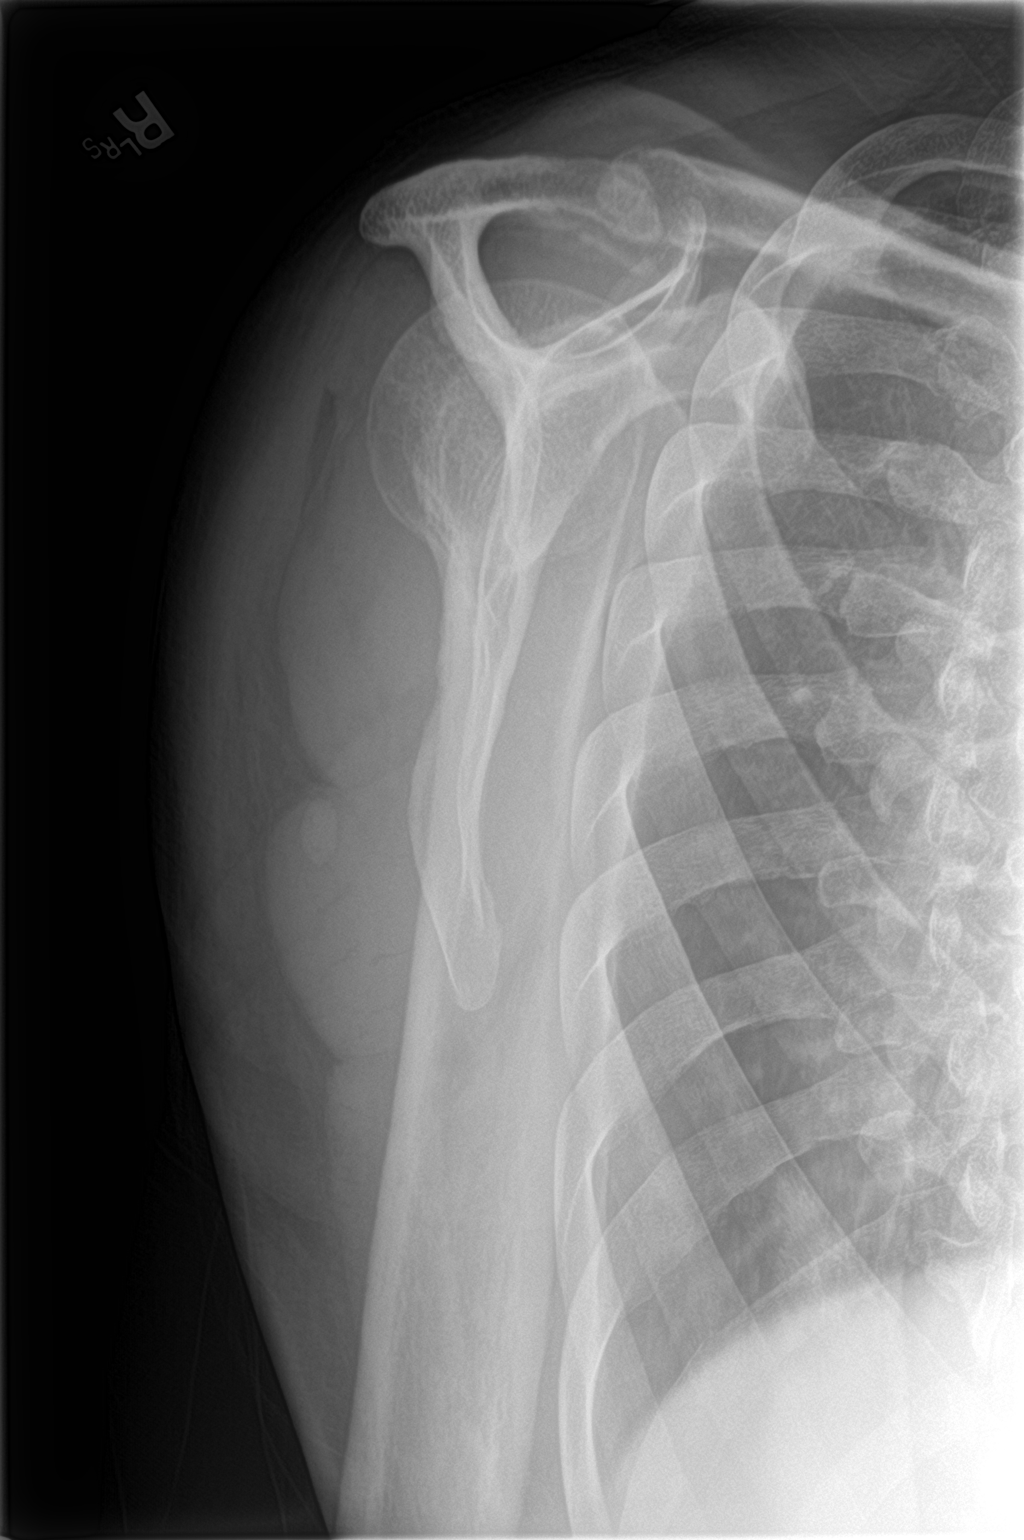
[im 3/3]
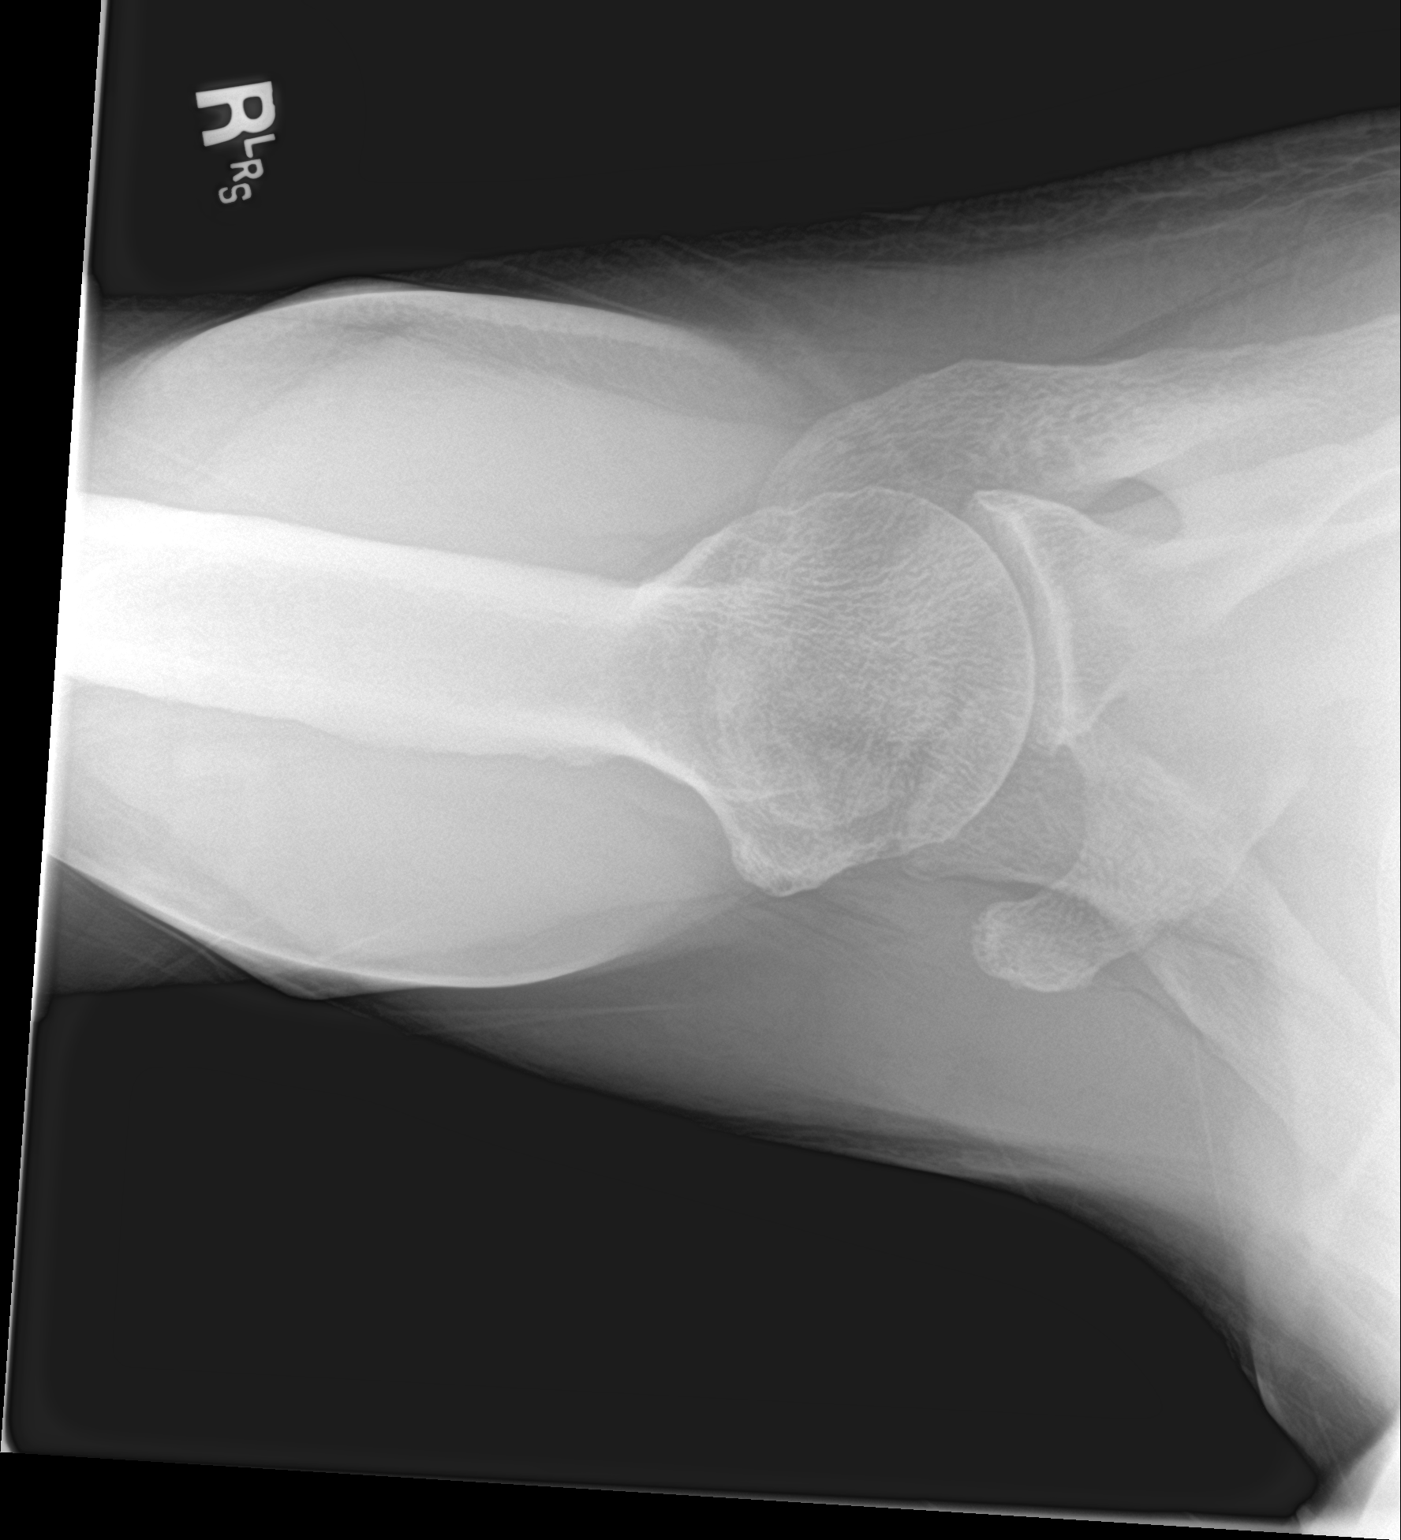

[3 of 3 positions shown; findings below may reference images not displayed]

FINDINGS: There is no evidence of fracture or dislocation. Mild glenohumeral
and acromioclavicular arthrosis. Soft tissues are unremarkable.
IMPRESSION: No fracture or dislocation of the right shoulder. Mild glenohumeral
and acromioclavicular arthrosis.

## 2020-09-10 ENCOUNTER — Emergency Department
Admission: EM | Admit: 2020-09-10 | Discharge: 2020-09-10 | Disposition: A | Discharge status: Home / Self Care | Payer: BC Managed Care – PPO | Attending: Emergency Medicine | Admitting: Emergency Medicine

## 2020-09-10 ENCOUNTER — Other Ambulatory Visit: Payer: Self-pay

## 2020-09-10 ENCOUNTER — Encounter: Payer: Self-pay | Admitting: Emergency Medicine

## 2020-09-10 ENCOUNTER — Emergency Department: Payer: BC Managed Care – PPO

## 2020-09-10 DIAGNOSIS — E119 Type 2 diabetes mellitus without complications: Secondary | ICD-10-CM | POA: Diagnosis not present

## 2020-09-10 DIAGNOSIS — I10 Essential (primary) hypertension: Secondary | ICD-10-CM | POA: Insufficient documentation

## 2020-09-10 DIAGNOSIS — M549 Dorsalgia, unspecified: Secondary | ICD-10-CM | POA: Diagnosis not present

## 2020-09-10 DIAGNOSIS — Z79899 Other long term (current) drug therapy: Secondary | ICD-10-CM | POA: Insufficient documentation

## 2020-09-10 DIAGNOSIS — R1011 Right upper quadrant pain: Secondary | ICD-10-CM

## 2020-09-10 DIAGNOSIS — F17210 Nicotine dependence, cigarettes, uncomplicated: Secondary | ICD-10-CM | POA: Diagnosis not present

## 2020-09-10 DIAGNOSIS — K5901 Slow transit constipation: Secondary | ICD-10-CM

## 2020-09-10 DIAGNOSIS — Z7984 Long term (current) use of oral hypoglycemic drugs: Secondary | ICD-10-CM | POA: Insufficient documentation

## 2020-09-10 LAB — URINALYSIS, COMPLETE (UACMP) WITH MICROSCOPIC
Bacteria, UA: NONE SEEN
Bilirubin Urine: NEGATIVE
Glucose, UA: 50 mg/dL — AB
Ketones, ur: NEGATIVE mg/dL
Leukocytes,Ua: NEGATIVE
Nitrite: NEGATIVE
Protein, ur: NEGATIVE mg/dL
Specific Gravity, Urine: >1.046 — ABNORMAL HIGH (ref 1.005–1.030)
pH: 5 (ref 5.0–8.0)

## 2020-09-10 LAB — CBC
HCT: 41.6 % (ref 39.0–52.0)
Hemoglobin: 14.1 g/dL (ref 13.0–17.0)
MCH: 29 pg (ref 26.0–34.0)
MCHC: 33.9 g/dL (ref 30.0–36.0)
MCV: 85.6 fL (ref 80.0–100.0)
Platelets: 293 10*3/uL (ref 150–400)
RBC: 4.86 MIL/uL (ref 4.22–5.81)
RDW: 12.4 % (ref 11.5–15.5)
WBC: 9.5 10*3/uL (ref 4.0–10.5)
nRBC: 0 % (ref 0.0–0.2)

## 2020-09-10 LAB — COMPREHENSIVE METABOLIC PANEL
ALT: 21 U/L (ref 0–44)
AST: 21 U/L (ref 15–41)
Albumin: 4.4 g/dL (ref 3.5–5.0)
Alkaline Phosphatase: 56 U/L (ref 38–126)
Anion gap: 8 (ref 5–15)
BUN: 12 mg/dL (ref 6–20)
CO2: 29 mmol/L (ref 22–32)
Calcium: 9.1 mg/dL (ref 8.9–10.3)
Chloride: 99 mmol/L (ref 98–111)
Creatinine, Ser: 1.08 mg/dL (ref 0.61–1.24)
GFR, Estimated: >60 mL/min (ref >60)
Glucose, Bld: 277 mg/dL — ABNORMAL HIGH (ref 70–99)
Potassium: 3.8 mmol/L (ref 3.5–5.1)
Sodium: 136 mmol/L (ref 135–145)
Total Bilirubin: 0.5 mg/dL (ref 0.3–1.2)
Total Protein: 7.3 g/dL (ref 6.5–8.1)

## 2020-09-10 LAB — LIPASE, BLOOD: Lipase: 116 U/L — ABNORMAL HIGH (ref 11–51)

## 2020-09-10 IMAGING — CT CT ABD-PELV W/ CM
2 of 5 series · 16 of 46 positions shown, 18 images · IV contrast (APPLIED)
Comparison: Abdominal ultrasound dated [DATE].

CLINICAL DATA: 59-year-old male with epigastric pain.

EXAM:
CT ABDOMEN AND PELVIS WITH CONTRAST
TECHNIQUE: Multidetector CT imaging of the abdomen and pelvis was performed
using the standard protocol following bolus administration of
intravenous contrast.
CONTRAST:  100mL OMNIPAQUE IOHEXOL 300 MG/ML  SOLN

[Series 2: routine abd/pel with · axial · 0.90mm/px · z∈[-447,+8]mm · 13 of 103 slices shown, 15 images]
[im 6/103  soft-tissue]
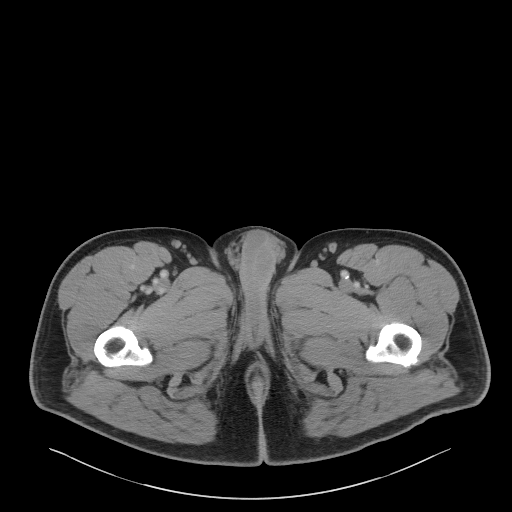
[im 6/103  bone]
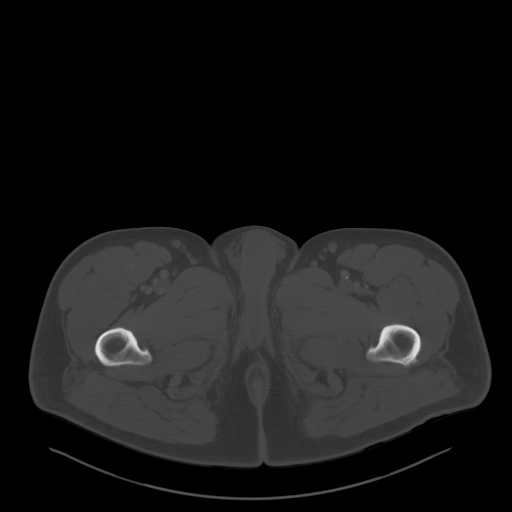
[im 12/103  soft-tissue]
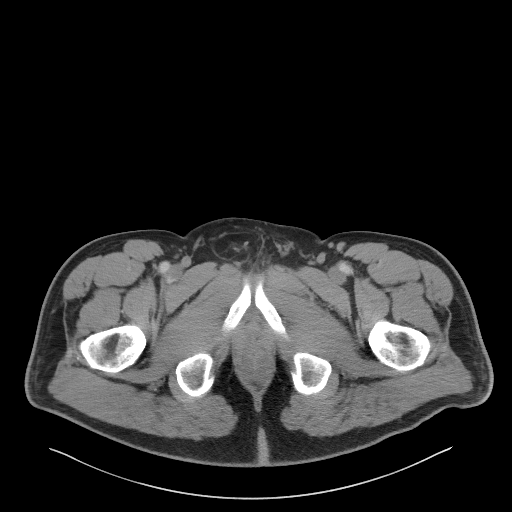
[im 23/103  soft-tissue]
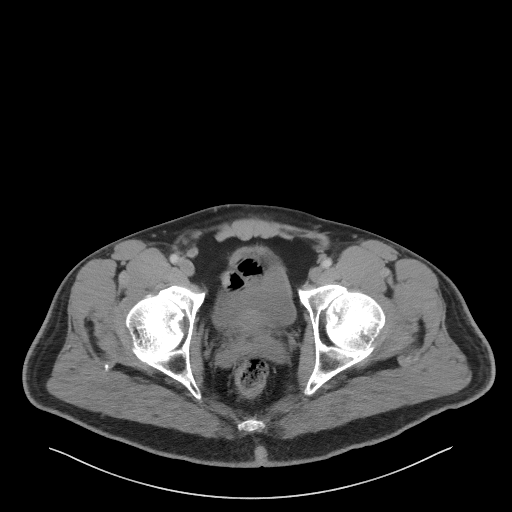
[im 29/103  soft-tissue]
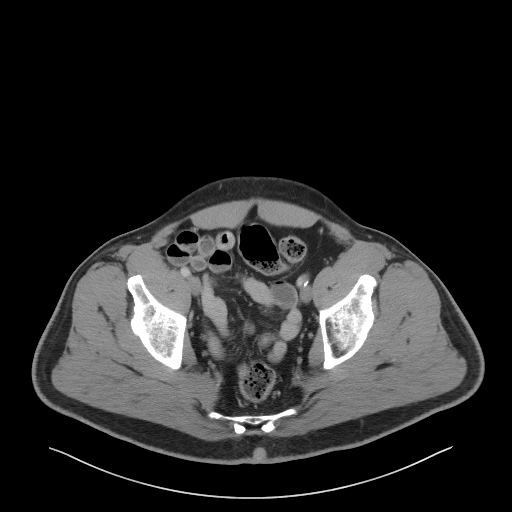
[im 35/103  soft-tissue]
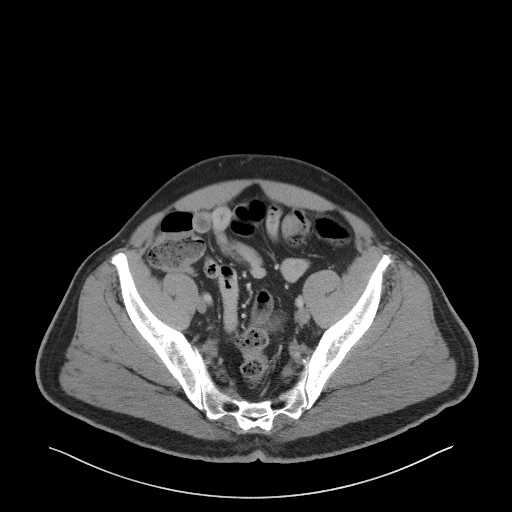
[im 46/103  soft-tissue]
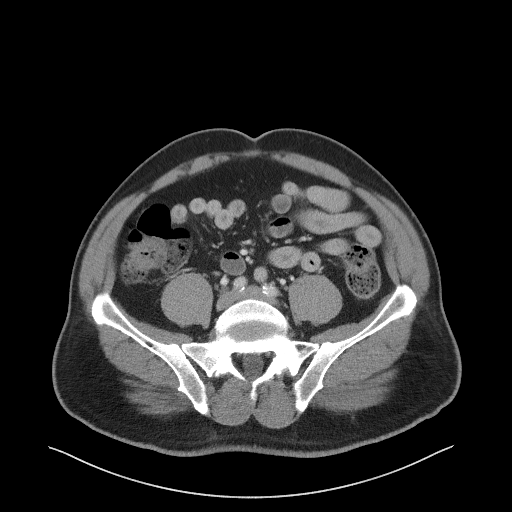
[im 52/103  soft-tissue]
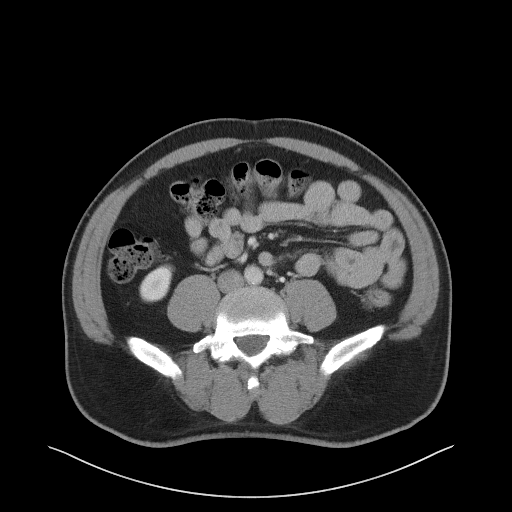
[im 57/103  soft-tissue]
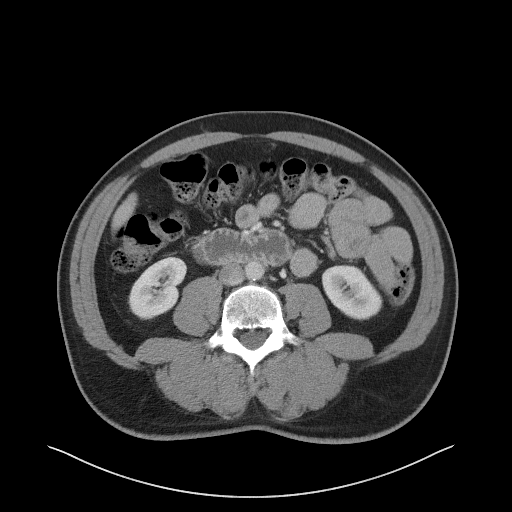
[im 69/103  soft-tissue]
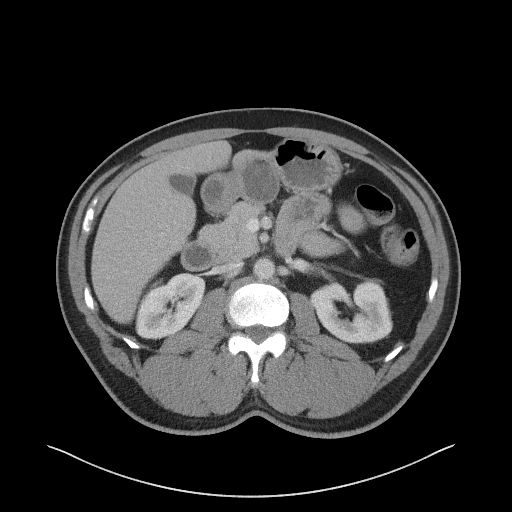
[im 69/103  bone]
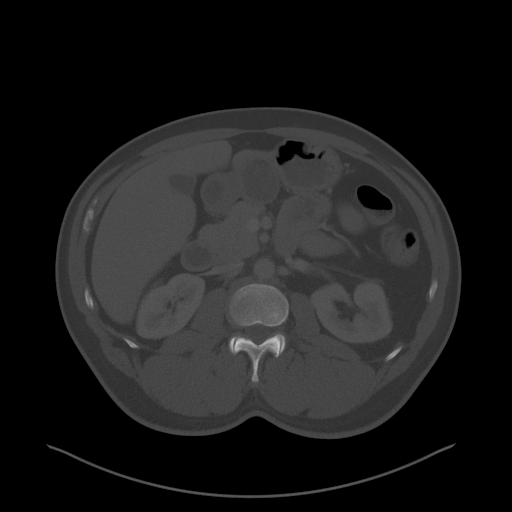
[im 74/103  soft-tissue]
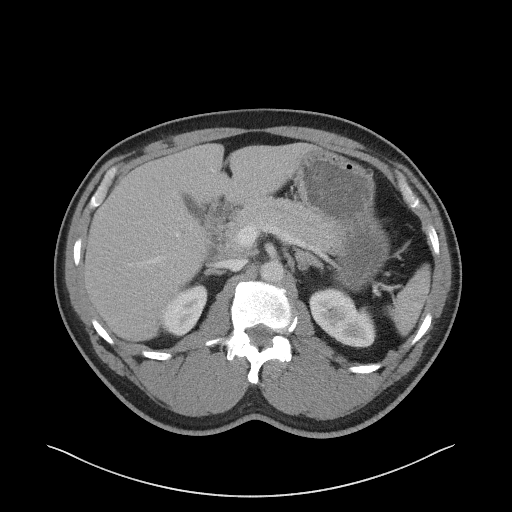
[im 80/103  soft-tissue]
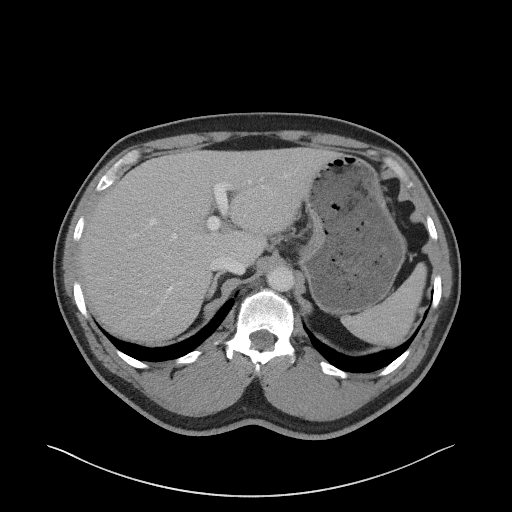
[im 91/103  soft-tissue]
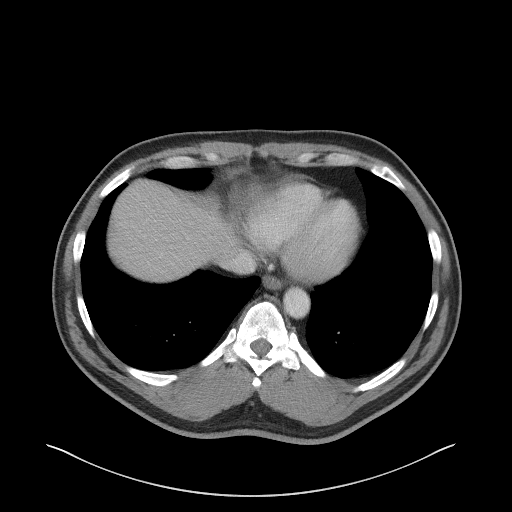
[im 97/103  soft-tissue]
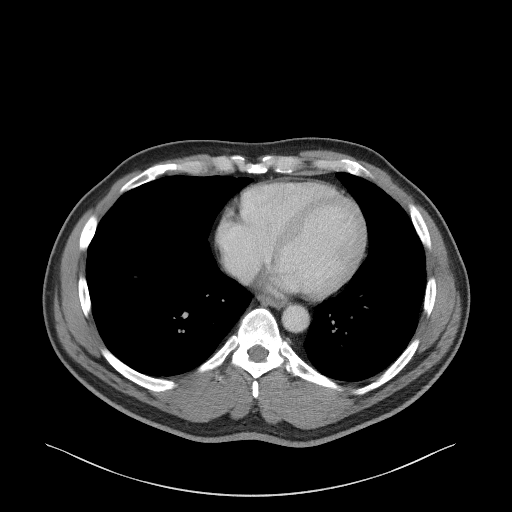

[Series 5: coronal st · coronal · 0.83mm/px · 3 of 109 slices shown]
[im 37/109  soft-tissue]
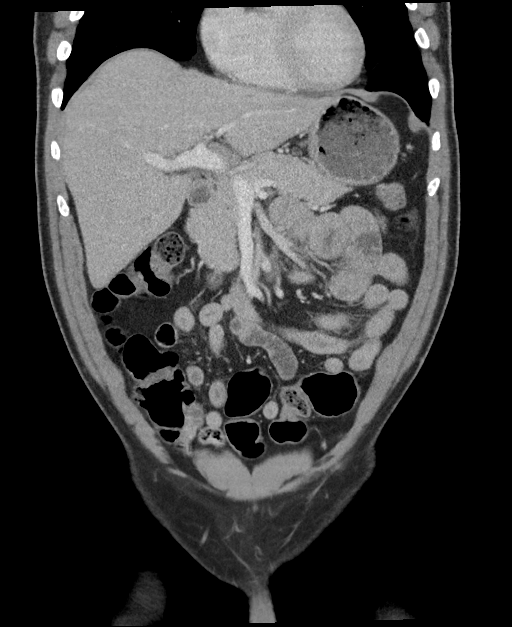
[im 49/109  soft-tissue]
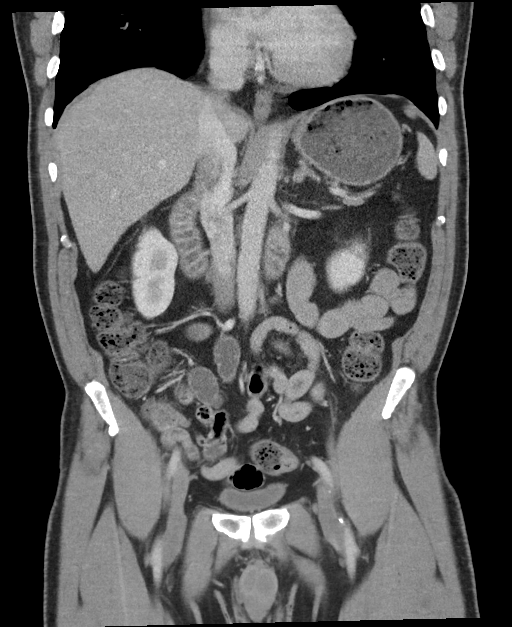
[im 61/109  soft-tissue]
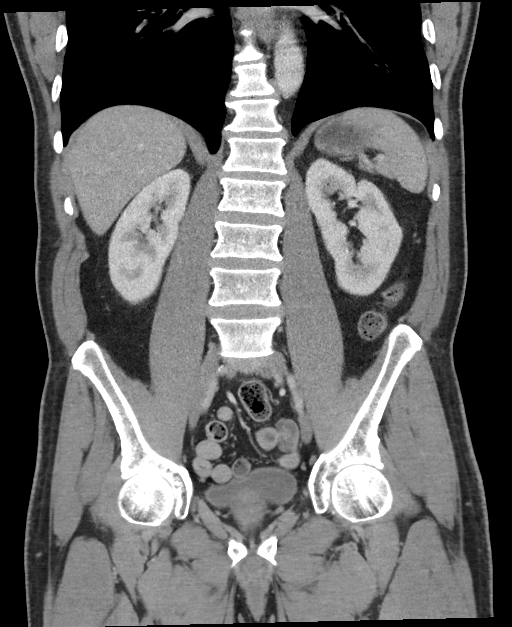

[16 of 46 positions shown; findings below may reference images not displayed]

FINDINGS: Lower chest: The visualized lung bases are clear.

No intra-abdominal free air or free fluid.

Hepatobiliary: No focal liver abnormality is seen. No gallstones,
gallbladder wall thickening, or biliary dilatation.

Pancreas: Unremarkable. No pancreatic ductal dilatation or
surrounding inflammatory changes.

Spleen: Normal in size without focal abnormality.

Adrenals/Urinary Tract: The adrenal glands unremarkable. The
kidneys, visualized ureters, and urinary bladder appear
unremarkable.

Stomach/Bowel: There is no bowel obstruction or active inflammation.
The appendix is normal.

Vascular/Lymphatic: Mild aortoiliac atherosclerotic disease. The IVC
is unremarkable. No portal venous gas. There is no adenopathy.

Reproductive: The prostate and seminal vesicles are grossly
unremarkable. No pelvic mass.

Other: Small fat containing umbilical hernia.

Musculoskeletal: Mild degenerative changes of the spine. No acute
osseous pathology.
IMPRESSION: 1. No acute intra-abdominal or pelvic pathology. No bowel
obstruction. Normal appendix.
2. Aortic Atherosclerosis ([M0]-[M0]).

## 2020-09-10 MED ORDER — POLYETHYLENE GLYCOL 3350 17 GM/SCOOP PO POWD: 1.0000 | Freq: Once | ORAL | 0 refills | Status: AC

## 2020-09-10 MED ORDER — ONDANSETRON HCL 4 MG/2ML IJ SOLN
4.0000 mg | Freq: Once | INTRAMUSCULAR | Status: AC
  Administered 2020-09-10: 4 mg via INTRAVENOUS
  Filled 2020-09-10: qty 2

## 2020-09-10 MED ORDER — SODIUM CHLORIDE 0.9 % IV BOLUS
1000.0000 mL | Freq: Once | INTRAVENOUS | Status: AC
  Administered 2020-09-10: 1000 mL via INTRAVENOUS

## 2020-09-10 MED ORDER — MORPHINE SULFATE (PF) 4 MG/ML IV SOLN
4.0000 mg | Freq: Once | INTRAVENOUS | Status: AC
Start: 2020-09-10 — End: 2020-09-10
  Administered 2020-09-10: 4 mg via INTRAVENOUS
  Filled 2020-09-10: qty 1

## 2020-09-10 MED ORDER — IOHEXOL 300 MG/ML  SOLN
100.0000 mL | Freq: Once | INTRAMUSCULAR | Status: AC | PRN
  Administered 2020-09-10: 100 mL via INTRAVENOUS

## 2020-09-10 NOTE — Discharge Instructions (Signed)
Please use MiraLAX one half capful every hour until your first bowel movement.  Do not take any more MiraLAX for the following 24 hours.  Please maintain hydration as he will be having some loose stools.  After 24 hours, you may use as much MiraLAX as is necessary for one solid bowel movement a day

## 2020-09-10 NOTE — ED Triage Notes (Signed)
Pt to ED via POV stating that he was seen and diagnosed with acute pancreatitis recently. Pt states that he has been doing everything that he was told to do but he is still having pain in the RUQ that goes around into his back. Pt denies vomiting or diarrhea. Pt states that he has not been able to have a bowel movement. Pt states that he has been taking laxatives without relief. Pt is in NAD.

## 2020-09-10 NOTE — ED Notes (Signed)
Sig pad not working. Pt verbalized understanding of AVS with no questions. Ambulated off unit without difficulty.

## 2020-09-10 NOTE — ED Provider Notes (Signed)
[facility] Emergency Department Provider Note   ____________________________________________   Event Date/Time   First MD Initiated Contact with Patient 09/10/20 2018     (approximate)  I have reviewed the triage vital signs and the nursing notes.   HISTORY  Chief Complaint Abdominal Pain    HPI Joseph Daugherty is a 60 y.o. male with stated past medical history of hypertension, hyperlipidemia, and type 2 diabetes who presents for right upper quadrant abdominal pain that radiates around to his back.  Patient states that he has had associated constipation.  Patient states that this is cramping, 8/10 pain that is worse after p.o. intake and partially relieved with a bowel movement.  Patient states that he has not had a solid bowel movement in the last week.  Patient states that he has tried magnesium citrate and castor oil with no relief of the symptoms.  Patient currently denies any vision changes, tinnitus, difficulty speaking, facial droop, sore throat, chest pain, shortness of breath, dysuria, or weakness/numbness/paresthesias in any extremity         Past Medical History:  Diagnosis Date  . Diabetes mellitus without complication ([place])   . Hyperlipidemia   . Hypertension     Patient Active Problem List   Diagnosis Date Noted  . Acute pain of right shoulder 01/07/2020  . Allergic rhinitis 05/26/2019  . ED (erectile dysfunction) of organic origin 05/26/2019  . BPH (benign prostatic hyperplasia) 10/21/2018  . Chronic obstructive pulmonary disease ([place]) 01/15/2018  . Nicotine dependence 01/15/2018  . Abnormal EKG 04/11/2017  . Type 2 diabetes mellitus without complication, without long-term current use of insulin ([place]) 04/11/2017  . CAP (community acquired pneumonia) 10/20/2010  . Current smoker 10/20/2010  . 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 HLD (hyperlipidemia) 09/16/2010  . Hypertension, benign 09/16/2010  . Pancreatitis 09/16/2010    Past  Surgical History:  Procedure Laterality Date  . PROSTATE BIOPSY  01/30/2020    Prior to Admission medications   Medication Sig Start Date End Date Taking? Authorizing Provider  polyethylene glycol powder (MIRALAX) 17 GM/SCOOP powder Take 255 g by mouth once for 1 dose. 09/10/20 09/10/20 Yes Naaman Plummer, MD  amLODipine (NORVASC) 10 MG tablet Take 1 tablet by mouth daily. 01/07/20   [provider]  atenolol (TENORMIN) 50 MG tablet Take 50 mg by mouth daily.    [provider]  cyclobenzaprine (FLEXERIL) 5 MG tablet Take by mouth. 01/07/20   [provider]  dapagliflozin propanediol (FARXIGA) 10 MG TABS tablet Take 10 mg by mouth daily. Patient not taking: Reported on 06/04/2020    [provider]  finasteride (PROSCAR) 5 MG tablet Take 1 tablet (5 mg total) by mouth daily. 06/04/20   Festus Aloe, MD  Fluticasone-Salmeterol (ADVAIR) 100-50 MCG/DOSE AEPB Inhale into the lungs. 01/07/20   [provider]  glipiZIDE (GLUCOTROL XL) 5 MG 24 hr tablet Take 5 mg by mouth daily. 05/28/20   [provider]  ibuprofen (ADVIL,MOTRIN) 600 MG tablet Take 1 tablet (600 mg total) by mouth every 6 (six) hours as needed. 09/07/18   Arta Silence, MD  lidocaine (LIDODERM) 5 % Place 1 patch onto the skin every 12 (twelve) hours. Remove & Discard patch within 12 hours or as directed by MD 12/13/19 12/12/20  Sable Feil, PA-C  lisinopril (PRINIVIL,ZESTRIL) 40 MG tablet Take 40 mg by mouth daily.    [provider]  lisinopril-hydrochlorothiazide (ZESTORETIC) 20-12.5 MG tablet Take 1 tablet by mouth daily. 05/28/20  [provider]  meloxicam (MOBIC) 15 MG tablet Take 15 mg by mouth daily.    [provider]  metFORMIN (GLUCOPHAGE) 1000 MG tablet Take 1,000 mg by mouth 2 (two) times daily. 05/28/20   [provider]  ondansetron (ZOFRAN ODT) 8 MG disintegrating tablet Take 1 tablet (8 mg total) by mouth every 8 (eight)  hours as needed for nausea or vomiting. 08/31/20   Arta Silence, MD  rosuvastatin (CRESTOR) 10 MG tablet Take 10 mg by mouth daily.    [provider]  sildenafil (VIAGRA) 100 MG tablet TAKE (1) TABLET BY MOUTH 1 HOUR PRIOR TO SEXUAL ACTIVITY. ~MAX OF 1 TAB PER DAY~ 12/10/19   [provider]  sitaGLIPtin-metformin (JANUMET) 50-1000 MG tablet Take 1 tablet by mouth 2 (two) times daily with a meal. Patient not taking: Reported on 06/04/2020    [provider]  tamsulosin (FLOMAX) 0.4 MG CAPS capsule Take 1 capsule (0.4 mg total) by mouth daily. 06/04/20   Festus Aloe, MD    Allergies Penicillins  No family history on file.  Social History Social History   Tobacco Use  . Smoking status: Current Every Day Smoker    Packs/day: 0.25    Types: Cigarettes  . Smokeless tobacco: Never Used  Substance Use Topics  . Alcohol use: Never  . Drug use: Not Currently    Review of Systems Constitutional: No fever/chills Eyes: No visual changes. ENT: No sore throat. Cardiovascular: Denies chest pain. Respiratory: Denies shortness of breath. Gastrointestinal: Endorses abdominal pain.  No nausea, no vomiting.  No diarrhea. Genitourinary: Negative for dysuria. Musculoskeletal: Negative for acute arthralgias Skin: Negative for rash. Neurological: Negative for headaches, weakness/numbness/paresthesias in any extremity Psychiatric: Negative for suicidal ideation/homicidal ideation   ____________________________________________   PHYSICAL EXAM:  VITAL SIGNS: ED Triage Vitals  Enc Vitals Group     BP 09/10/20 1810 (!) 151/98     Pulse Rate 09/10/20 1810 (!) 101     Resp 09/10/20 1810 16     Temp 09/10/20 1810 98.9 F (37.2 C)     Temp Source 09/10/20 2241 Oral     SpO2 09/10/20 1810 98 %     Weight 09/10/20 1810 235 lb (106.6 kg)     Height 09/10/20 1810 6\' 4"  (1.93 m)     Head Circumference --      Peak Flow --      Pain Score 09/10/20 1810 8      Pain Loc --      Pain Edu? --      Excl. in [place]? --    Constitutional: Alert and oriented. Well appearing and in no acute distress. Eyes: Conjunctivae are normal. PERRL. Head: Atraumatic. Nose: No congestion/rhinnorhea. Mouth/Throat: Mucous membranes are moist. Neck: No stridor Cardiovascular: Grossly normal heart sounds.  Good peripheral circulation. Respiratory: Normal respiratory effort.  No retractions. Gastrointestinal: Soft and generalized tenderness to palpation in right upper quadrant, epigastric, and left upper quadrant regions. No distention. Musculoskeletal: No obvious deformities Neurologic:  Normal speech and language. No gross focal neurologic deficits are appreciated. Skin:  Skin is warm and dry. No rash noted. Psychiatric: Mood and affect are normal. Speech and behavior are normal.  ____________________________________________   LABS (all labs ordered are listed, but only abnormal results are displayed)  Labs Reviewed  LIPASE, BLOOD - Abnormal; Notable for the following components:      Result Value   Lipase 116 (*)    All other components within normal limits  COMPREHENSIVE  METABOLIC PANEL - Abnormal; Notable for the following components:   Glucose, Bld 277 (*)    All other components within normal limits  URINALYSIS, COMPLETE (UACMP) WITH MICROSCOPIC - Abnormal; Notable for the following components:   Color, Urine YELLOW (*)    APPearance CLEAR (*)    Specific Gravity, Urine >1.046 (*)    Glucose, UA 50 (*)    Hgb urine dipstick SMALL (*)    All other components within normal limits  CBC    RADIOLOGY  ED MD interpretation: CT of the abdomen and pelvis with IV contrast shows no evidence of acute abnormalities.  Patient does have a large stool burden consistent with constipation  Official radiology report(s): CT Abdomen Pelvis W Contrast  Result Date: 09/10/2020 CLINICAL DATA:  60 year old male with epigastric pain. EXAM: CT ABDOMEN AND PELVIS WITH  CONTRAST TECHNIQUE: Multidetector CT imaging of the abdomen and pelvis was performed using the standard protocol following bolus administration of intravenous contrast. CONTRAST:  185mL OMNIPAQUE IOHEXOL 300 MG/ML  SOLN COMPARISON:  Abdominal ultrasound dated 08/31/2020. FINDINGS: Lower chest: The visualized lung bases are clear. No intra-abdominal free air or free fluid. Hepatobiliary: No focal liver abnormality is seen. No gallstones, gallbladder wall thickening, or biliary dilatation. Pancreas: Unremarkable. No pancreatic ductal dilatation or surrounding inflammatory changes. Spleen: Normal in size without focal abnormality. Adrenals/Urinary Tract: The adrenal glands unremarkable. The kidneys, visualized ureters, and urinary bladder appear unremarkable. Stomach/Bowel: There is no bowel obstruction or active inflammation. The appendix is normal. Vascular/Lymphatic: Mild aortoiliac atherosclerotic disease. The IVC is unremarkable. No portal venous gas. There is no adenopathy. Reproductive: The prostate and seminal vesicles are grossly unremarkable. No pelvic mass. Other: Small fat containing umbilical hernia. Musculoskeletal: Mild degenerative changes of the spine. No acute osseous pathology. IMPRESSION: 1. No acute intra-abdominal or pelvic pathology. No bowel obstruction. Normal appendix. 2. Aortic Atherosclerosis (ICD10-I70.0). Electronically Signed   By: Anner Crete M.D.   On: 09/10/2020 21:12    ____________________________________________   PROCEDURES  Procedure(s) performed (including Critical Care):  .1-3 Lead EKG Interpretation Performed by: Naaman Plummer, MD Authorized by: Naaman Plummer, MD     Interpretation: normal     ECG rate:  74   ECG rate assessment: normal     Rhythm: sinus rhythm     Ectopy: none     Conduction: normal       ____________________________________________   INITIAL IMPRESSION / ASSESSMENT AND PLAN / ED COURSE  As part of my medical decision  making, I reviewed the following data within the [place] notes reviewed and incorporated, Labs reviewed, Old chart reviewed, Radiograph reviewed and Notes from prior ED visits reviewed and incorporated        Patients history and exam most consistent with constipation as an etiology for their pain.  Patients symptoms not typical for other emergent causes of abdominal pain such as, but not limited to, appendicitis, abdominal aortic aneurysm, pancreatitis, SBO, mesenteric ischemia, serious intra-abdominal bacterial illness.  Patient without red flags concerning for cancer as a constipation etiology.  Rx: Miralax  Disposition:  Patient will be discharged with strict return precautions and follow up with primary MD within 24-48 hours for further evaluation. Patient understands that this still may have an early presentation of an emergent medical condition such as appendicitis that will require a recheck.  Clinical Course as of 09/10/20 2243  Fri Sep 10, 2020  2018 Lipase(!): 116 [EB]    Clinical Course User Index [EB]  Naaman Plummer, MD     ____________________________________________   FINAL CLINICAL IMPRESSION(S) / ED DIAGNOSES  Final diagnoses:  Right upper quadrant abdominal pain  Slow transit constipation     ED Discharge Orders         Ordered    polyethylene glycol powder (MIRALAX) 17 GM/SCOOP powder   Once        09/10/20 2212           Note:  This document was prepared using Dragon voice recognition software and may include unintentional dictation errors.   Naaman Plummer, MD 09/10/20 2312

## 2020-11-09 ENCOUNTER — Other Ambulatory Visit: Payer: Self-pay | Admitting: *Deleted

## 2020-11-09 DIAGNOSIS — R972 Elevated prostate specific antigen [PSA]: Secondary | ICD-10-CM

## 2020-12-01 ENCOUNTER — Other Ambulatory Visit: Payer: BC Managed Care – PPO

## 2020-12-01 ENCOUNTER — Other Ambulatory Visit: Payer: Self-pay

## 2020-12-01 DIAGNOSIS — R972 Elevated prostate specific antigen [PSA]: Secondary | ICD-10-CM

## 2020-12-02 LAB — PSA: Prostate Specific Ag, Serum: 1.5 ng/mL (ref 0.0–4.0)

## 2020-12-03 ENCOUNTER — Ambulatory Visit: Payer: BC Managed Care – PPO | Admitting: Urology

## 2020-12-10 ENCOUNTER — Encounter: Payer: Self-pay | Admitting: Urology

## 2020-12-10 ENCOUNTER — Other Ambulatory Visit: Payer: Self-pay

## 2020-12-10 ENCOUNTER — Ambulatory Visit (INDEPENDENT_AMBULATORY_CARE_PROVIDER_SITE_OTHER): Payer: BC Managed Care – PPO | Admitting: Urology

## 2020-12-10 VITALS — BP 121/83 | HR 91 | Ht 76.0 in | Wt 239.0 lb

## 2020-12-10 DIAGNOSIS — N5201 Erectile dysfunction due to arterial insufficiency: Secondary | ICD-10-CM

## 2020-12-10 DIAGNOSIS — C61 Malignant neoplasm of prostate: Secondary | ICD-10-CM

## 2020-12-10 DIAGNOSIS — N401 Enlarged prostate with lower urinary tract symptoms: Secondary | ICD-10-CM

## 2020-12-10 MED ORDER — FINASTERIDE 5 MG PO TABS: 5.0000 mg | ORAL_TABLET | Freq: Every day | ORAL | 3 refills | Status: DC

## 2020-12-10 MED ORDER — TAMSULOSIN HCL 0.4 MG PO CAPS: 0.4000 mg | ORAL_CAPSULE | Freq: Every day | ORAL | 3 refills | Status: DC

## 2020-12-10 NOTE — Progress Notes (Signed)
12/10/2020 1:22 PM   Joseph Daugherty Nov 29, 1960 [id number]  Referring provider: Marion Downer, [place],  [place] [address]  Chief Complaint  Patient presents with  . Elevated PSA    Urologic history:  1.  T1c low risk prostate cancer -Dx 01/2020; PSA 6.7; 72 g prostate -2 cores Gleason 3+3 < 10%  2.  BPH with LUTS -On tamsulosin/finasteride  3.  Erectile dysfunction   HPI: 60 y.o. male presents for semiannual follow-up.   No significant problems since his last visit  No bothersome LUTS  Remains on tamsulosin and finasteride  PSA 12/01/2020 was 1.5  Denies dysuria, gross hematuria  No flank, abdominal or pelvic pain   PMH: Past Medical History:  Diagnosis Date  . Diabetes mellitus without complication ([place])   . Hyperlipidemia   . Hypertension     Surgical History: Past Surgical History:  Procedure Laterality Date  . PROSTATE BIOPSY  01/30/2020    Home Medications:  Allergies as of 12/10/2020      Reactions   Penicillins Hives, Anaphylaxis      Medication List       Accurate as of Dec 10, 2020  1:22 PM. If you have any questions, ask your nurse or doctor.        amLODipine 10 MG tablet Commonly known as: NORVASC Take 1 tablet by mouth daily.   atenolol 50 MG tablet Commonly known as: TENORMIN Take 50 mg by mouth daily.   cyclobenzaprine 5 MG tablet Commonly known as: FLEXERIL Take by mouth.   dapagliflozin propanediol 10 MG Tabs tablet Commonly known as: FARXIGA Take 10 mg by mouth daily.   finasteride 5 MG tablet Commonly known as: Proscar Take 1 tablet (5 mg total) by mouth daily.   Fluticasone-Salmeterol 100-50 MCG/DOSE Aepb Commonly known as: ADVAIR Inhale into the lungs.   glipiZIDE 5 MG 24 hr tablet Commonly known as: GLUCOTROL XL Take 5 mg by mouth daily.   ibuprofen 600 MG tablet Commonly known as: ADVIL Take 1 tablet (600 mg total) by mouth every 6 (six) hours as needed.   lidocaine 5  % Commonly known as: Lidoderm Place 1 patch onto the skin every 12 (twelve) hours. Remove & Discard patch within 12 hours or as directed by MD   lisinopril 40 MG tablet Commonly known as: ZESTRIL Take 40 mg by mouth daily.   lisinopril-hydrochlorothiazide 20-12.5 MG tablet Commonly known as: ZESTORETIC Take 1 tablet by mouth daily.   meloxicam 15 MG tablet Commonly known as: MOBIC Take 15 mg by mouth daily.   metFORMIN 1000 MG tablet Commonly known as: GLUCOPHAGE Take 1,000 mg by mouth 2 (two) times daily.   ondansetron 8 MG disintegrating tablet Commonly known as: Zofran ODT Take 1 tablet (8 mg total) by mouth every 8 (eight) hours as needed for nausea or vomiting.   rosuvastatin 10 MG tablet Commonly known as: CRESTOR Take 10 mg by mouth daily.   sildenafil 100 MG tablet Commonly known as: VIAGRA TAKE (1) TABLET BY MOUTH 1 HOUR PRIOR TO SEXUAL ACTIVITY. ~MAX OF 1 TAB PER DAY~   sitaGLIPtin-metformin 50-1000 MG tablet Commonly known as: JANUMET Take 1 tablet by mouth 2 (two) times daily with a meal.   tamsulosin 0.4 MG Caps capsule Commonly known as: FLOMAX Take 1 capsule (0.4 mg total) by mouth daily.       Allergies:  Allergies  Allergen Reactions  . Penicillins Hives and Anaphylaxis    Family History: History reviewed.  No pertinent family history.  Social History:  reports that he has been smoking cigarettes. He has been smoking about 0.25 packs per day. He has never used smokeless tobacco. He reports previous drug use. He reports that he does not drink alcohol.   Physical Exam: BP 121/83   Pulse 91   Ht 6\' 4"  (1.93 m)   Wt 239 lb (108.4 kg)   BMI 29.09 kg/m   Constitutional:  Alert and oriented, No acute distress. HEENT: [place] AT, moist mucus membranes.  Trachea midline, no masses. Cardiovascular: No clubbing, cyanosis, or edema. Respiratory: Normal respiratory effort, no increased work of breathing. GU: Prostate 50 g, smooth without nodules Skin:  No rashes, bruises or suspicious lesions. Neurologic: Grossly intact, no focal deficits, moving all 4 extremities. Psychiatric: Normal mood and affect.   Assessment & Plan:    1.  T1c low risk prostate cancer  PSA 1.5  Benign DRE  Desires to continue active surveillance and will follow up in 6 months for symptom check and PSA  2.  BPH with LUTS  Doing well on combination therapy  Tamsulosin and finasteride were refilled  3.  Erectile dysfunction  Stable   [name], MD  [facility] [address] [place], [place] [address] [phone]

## 2020-12-28 ENCOUNTER — Other Ambulatory Visit (HOSPITAL_COMMUNITY): Payer: Self-pay | Admitting: Internal Medicine

## 2020-12-28 ENCOUNTER — Other Ambulatory Visit: Payer: Self-pay | Admitting: Internal Medicine

## 2020-12-28 DIAGNOSIS — R1084 Generalized abdominal pain: Secondary | ICD-10-CM

## 2021-01-17 ENCOUNTER — Other Ambulatory Visit: Payer: Self-pay

## 2021-01-17 ENCOUNTER — Ambulatory Visit
Admission: RE | Admit: 2021-01-17 | Discharge: 2021-01-17 | Disposition: A | Discharge status: Home / Self Care | Payer: BC Managed Care – PPO | Source: Ambulatory Visit | Attending: Internal Medicine | Admitting: Internal Medicine

## 2021-01-17 DIAGNOSIS — R1084 Generalized abdominal pain: Secondary | ICD-10-CM | POA: Insufficient documentation

## 2021-01-17 IMAGING — CT CT ABD-PELV W/ CM
2 of 5 series · 16 of 46 positions shown, 18 images · IV contrast (APPLIED)
Comparison: [DATE]

CLINICAL DATA: Left flank and lower quadrant pain for several
months

EXAM:
CT ABDOMEN AND PELVIS WITH CONTRAST
TECHNIQUE: Multidetector CT imaging of the abdomen and pelvis was performed
using the standard protocol following bolus administration of
intravenous contrast.
CONTRAST:  100mL OMNIPAQUE IOHEXOL 300 MG/ML  SOLN

[Series 2: axial st · axial · 0.82mm/px · z∈[+12,+437]mm · 13 of 97 slices shown, 15 images]
[im 6/97  soft-tissue]
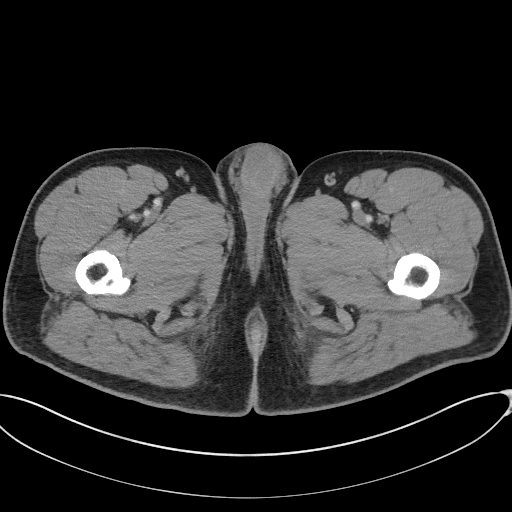
[im 6/97  bone]
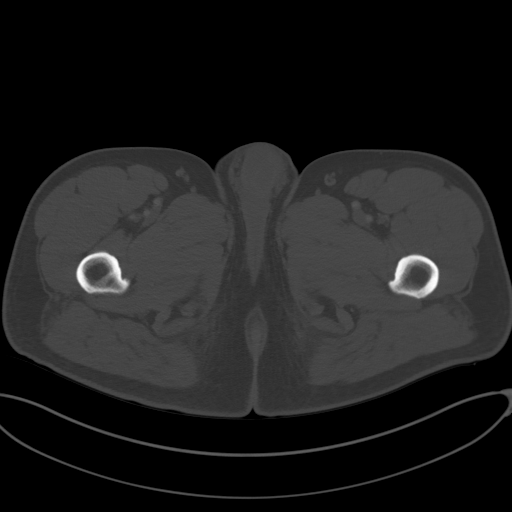
[im 12/97  soft-tissue]
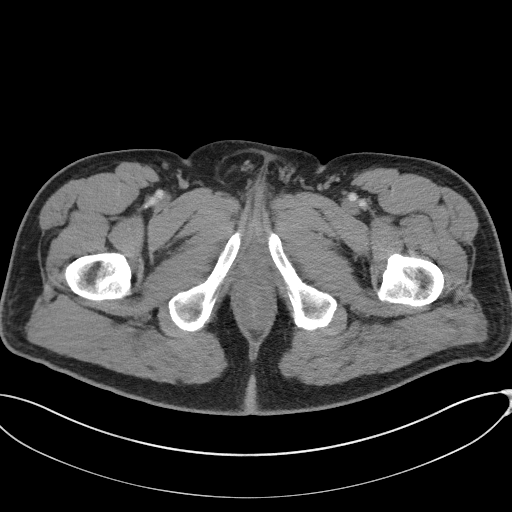
[im 23/97  soft-tissue]
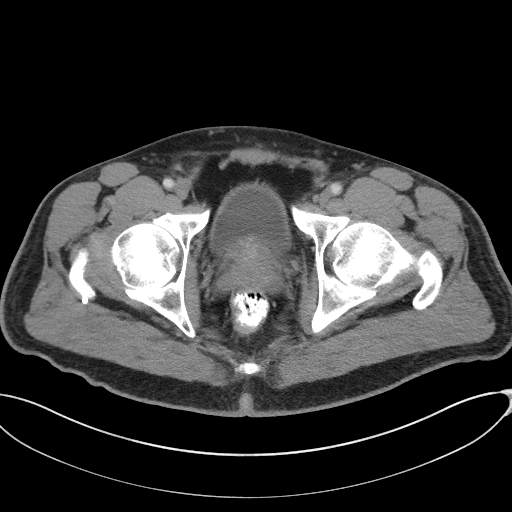
[im 29/97  soft-tissue]
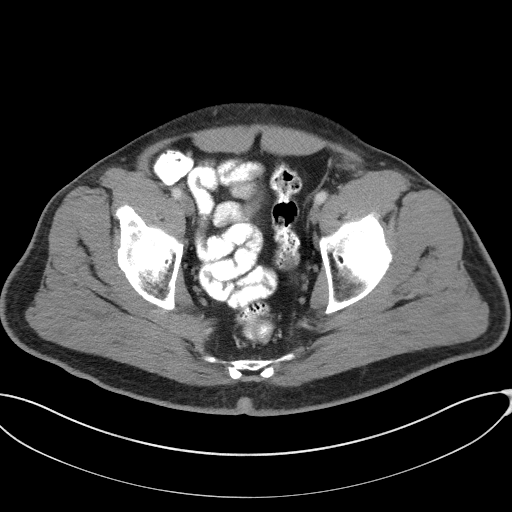
[im 34/97  soft-tissue]
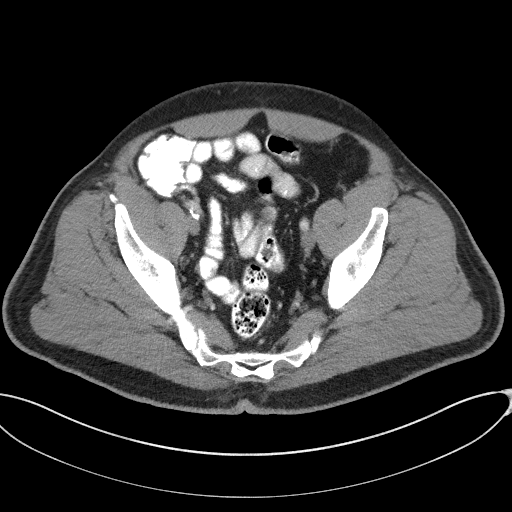
[im 40/97  soft-tissue]
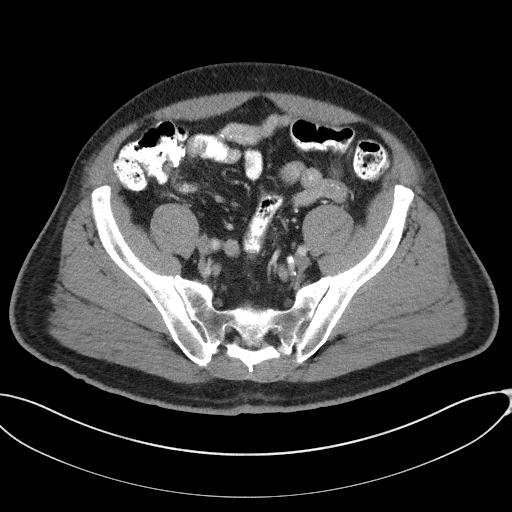
[im 51/97  soft-tissue]
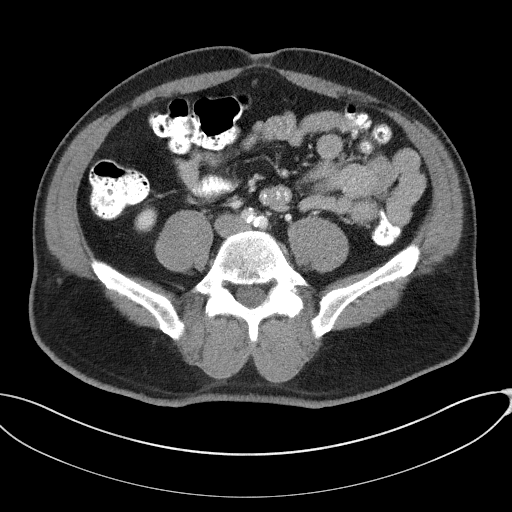
[im 57/97  soft-tissue]
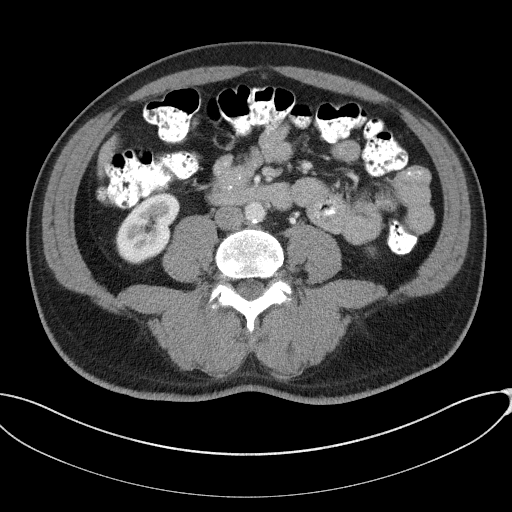
[im 63/97  soft-tissue]
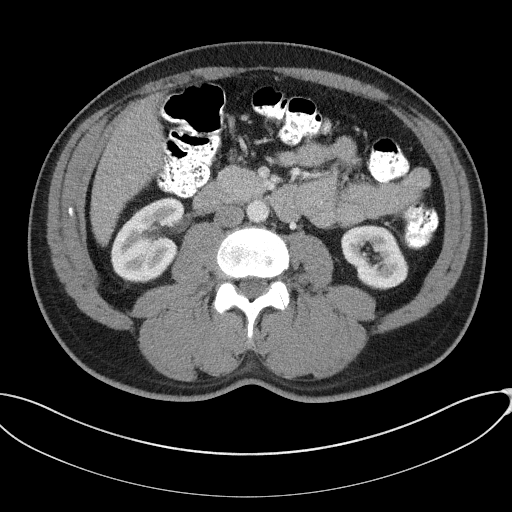
[im 63/97  bone]
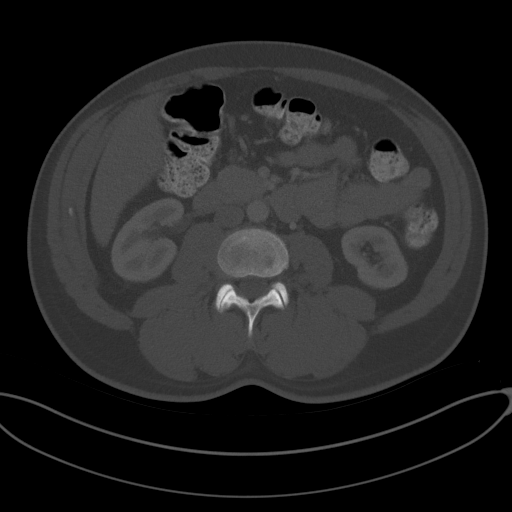
[im 68/97  soft-tissue]
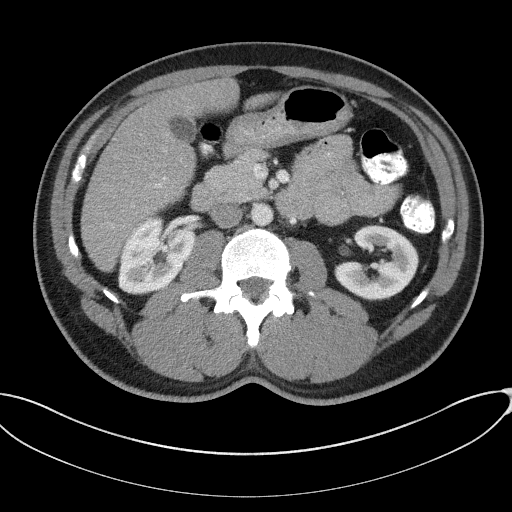
[im 74/97  soft-tissue]
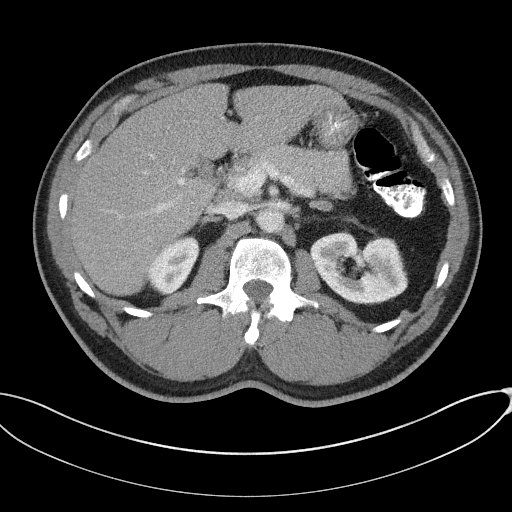
[im 85/97  soft-tissue]
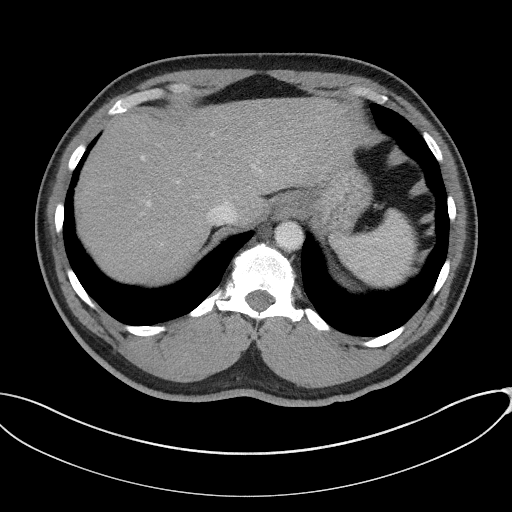
[im 91/97  soft-tissue]
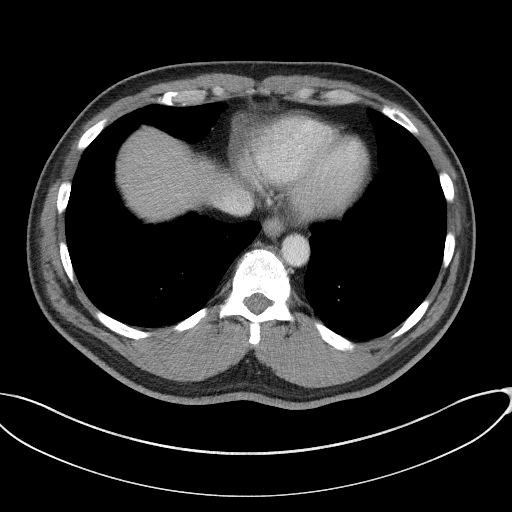

[Series 5: coronal st · coronal · 0.84mm/px · 3 of 100 slices shown]
[im 34/100  soft-tissue]
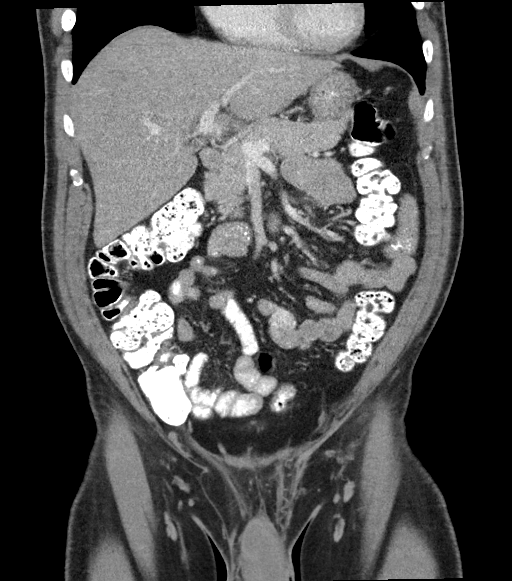
[im 45/100  soft-tissue]
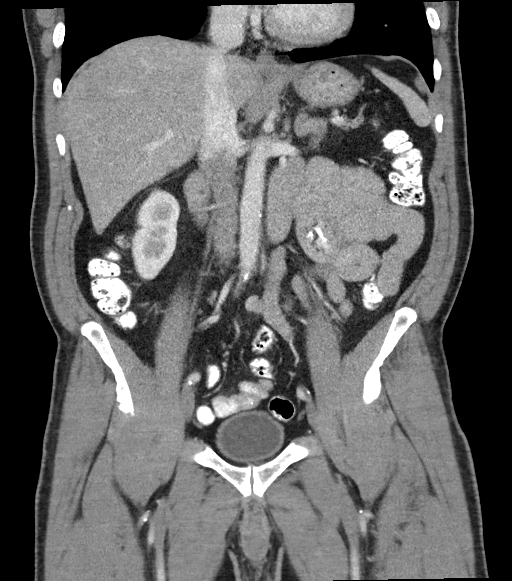
[im 56/100  soft-tissue]
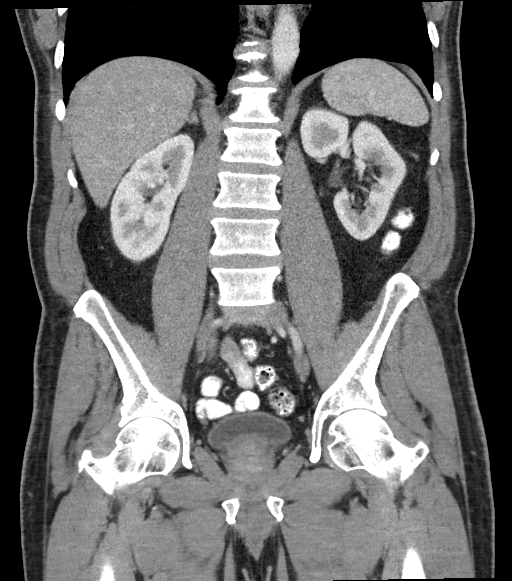

[16 of 46 positions shown; findings below may reference images not displayed]

FINDINGS: Lower chest: Lung bases are well aerated. A 5 mm nodule is noted
along the diaphragm in the right lower lobe best seen on image
number 11 of series 4. This is stable from the prior exam but better
visualized on the current exam.

Hepatobiliary: Fatty infiltration of the liver is noted. The
gallbladder is within normal limits.

Pancreas: Unremarkable. No pancreatic ductal dilatation or
surrounding inflammatory changes.

Spleen: Normal in size without focal abnormality.

Adrenals/Urinary Tract: Adrenal glands are stable in appearance.
Normal enhancement of the kidneys is seen. No renal calculi are
noted. Bladder is partially distended. No obstructive changes are
seen. Normal excretion on delayed images is noted.

Stomach/Bowel: No obstructive or inflammatory changes of the colon
are seen. The appendix is within normal limits. Small bowel shows no
abnormality. Stomach is decompressed.

Vascular/Lymphatic: Aortic atherosclerosis. No enlarged abdominal or
pelvic lymph nodes.

Reproductive: Prostate is unremarkable.

Other: No abdominal wall hernia or abnormality. No abdominopelvic
ascites.

Musculoskeletal: Degenerative changes of lumbar spine are noted.
IMPRESSION: Fatty liver.

5 mm nodule in the right lower lobe. No follow-up needed if patient
is low-risk. Non-contrast chest CT can be considered in 12 months if
patient is high-risk. This recommendation follows the consensus
statement: Guidelines for Management of Incidental Pulmonary Nodules
Detected on CT Images: From the [HOSPITAL] [HC]; Radiology
[HC]; [DATE].

No acute abnormality to correspond with the patient's given clinical
history is noted.

## 2021-01-17 MED ORDER — IOHEXOL 300 MG/ML  SOLN
100.0000 mL | Freq: Once | INTRAMUSCULAR | Status: AC | PRN
  Administered 2021-01-17: 100 mL via INTRAVENOUS

## 2021-01-22 LAB — COLOGUARD: COLOGUARD: POSITIVE — AB

## 2021-02-17 ENCOUNTER — Other Ambulatory Visit: Payer: Self-pay | Admitting: Family Medicine

## 2021-02-17 DIAGNOSIS — M5440 Lumbago with sciatica, unspecified side: Secondary | ICD-10-CM

## 2021-03-03 ENCOUNTER — Ambulatory Visit
Admission: RE | Admit: 2021-03-03 | Discharge: 2021-03-03 | Disposition: A | Discharge status: Home / Self Care | Payer: BC Managed Care – PPO | Source: Ambulatory Visit | Attending: Family Medicine | Admitting: Family Medicine

## 2021-03-03 ENCOUNTER — Other Ambulatory Visit: Payer: Self-pay

## 2021-03-03 DIAGNOSIS — M5440 Lumbago with sciatica, unspecified side: Secondary | ICD-10-CM | POA: Diagnosis present

## 2021-03-03 IMAGING — MR MR LUMBAR SPINE W/O CM
5 series · 31 of 48 positions shown · non-contrast
Comparison: None.

CLINICAL DATA: Low back pain with numbness and tingling right leg.

EXAM:
MRI LUMBAR SPINE WITHOUT CONTRAST
TECHNIQUE: Multiplanar, multisequence MR imaging of the lumbar spine was
performed. No intravenous contrast was administered.

[Series 5: T2 · sagittal · 4.0mm · 0.88mm/px · 6 of 17 slices shown (1 of 2)]
[im 1/17]
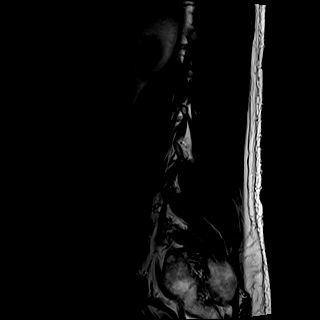
[im 4/17]
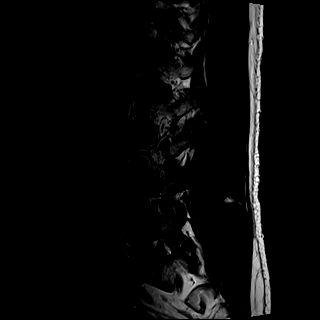
[im 7/17]
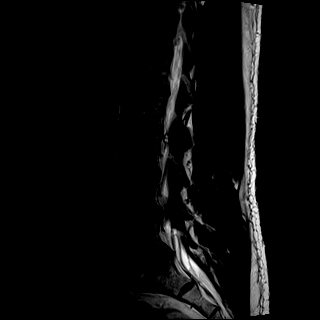
[im 10/17]
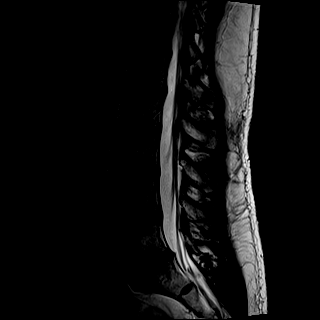
[im 13/17]
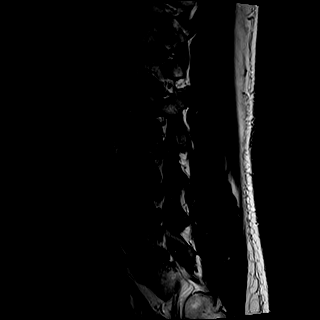
[im 17/17]
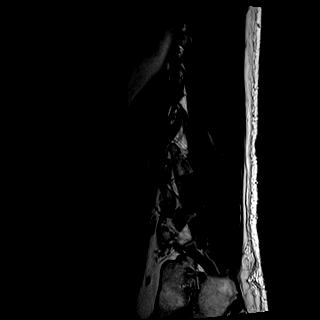

[Series 6: T1 · sagittal · 4.0mm · 0.88mm/px · 6 of 17 slices shown (1 of 2)]
[im 1/17]
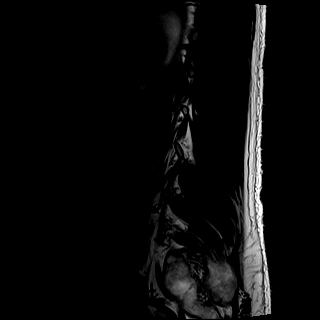
[im 4/17]
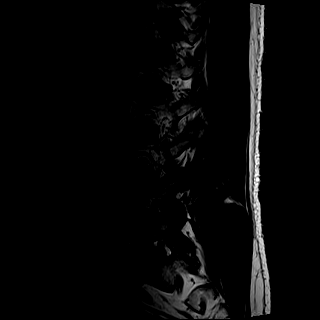
[im 7/17]
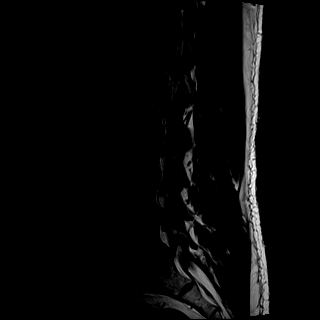
[im 10/17]
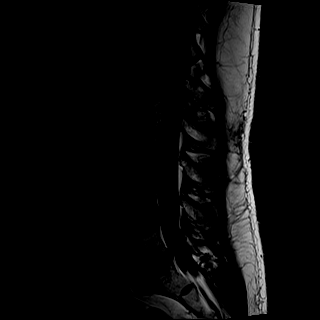
[im 13/17]
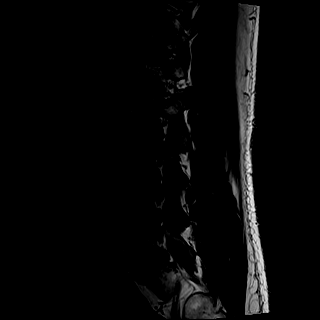
[im 17/17]
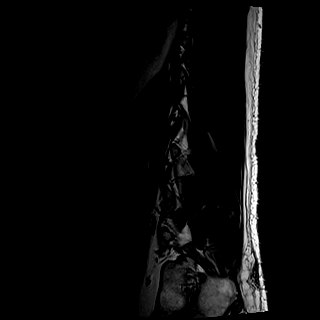

[Series 7: STIR · sagittal · 4.0mm · 0.44mm/px · 1 of 17 slices shown]
[im 1/17]
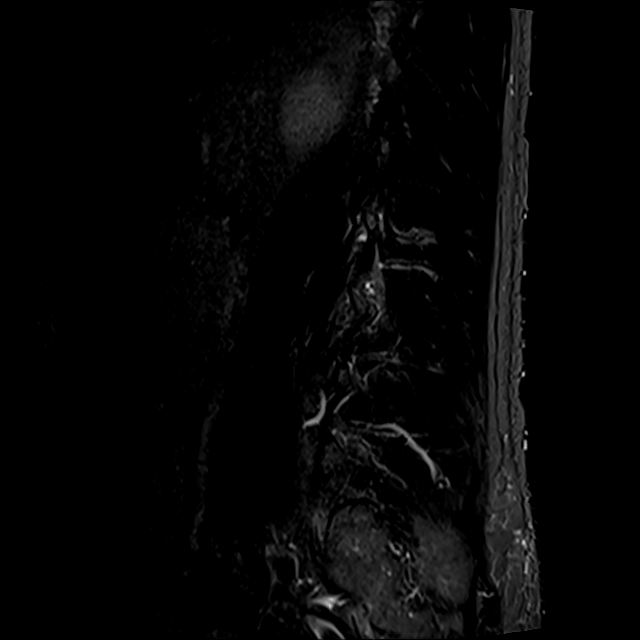

[Series 8: T2 · axial · 4.0mm · 0.78mm/px · z∈[-146,+84]mm · 9 of 39 slices shown (2 of 2)]
[im 1/39]
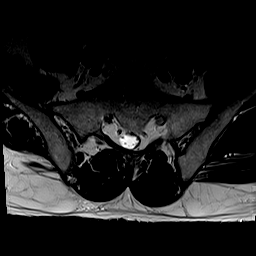
[im 6/39]
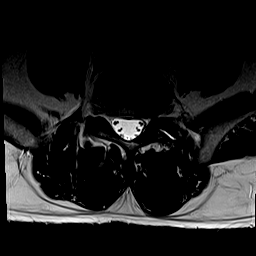
[im 11/39]
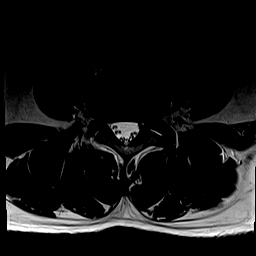
[im 17/39]
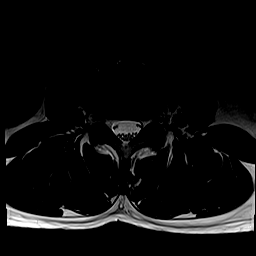
[im 20/39]
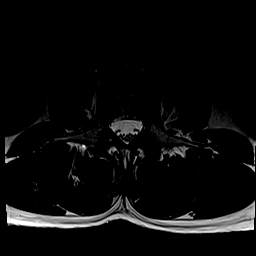
[im 22/39]
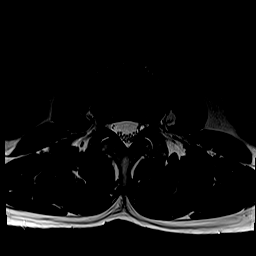
[im 28/39]
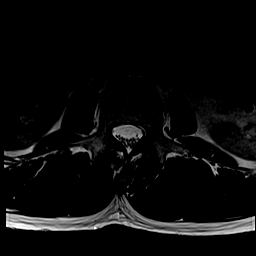
[im 33/39]
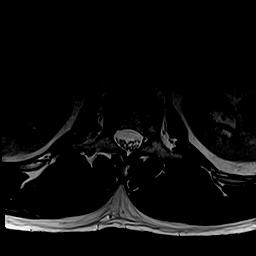
[im 39/39]
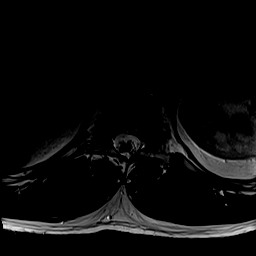

[Series 9: T1 · axial · 4.0mm · 0.39mm/px · z∈[-146,+84]mm · 9 of 39 slices shown (2 of 2)]
[im 1/39]
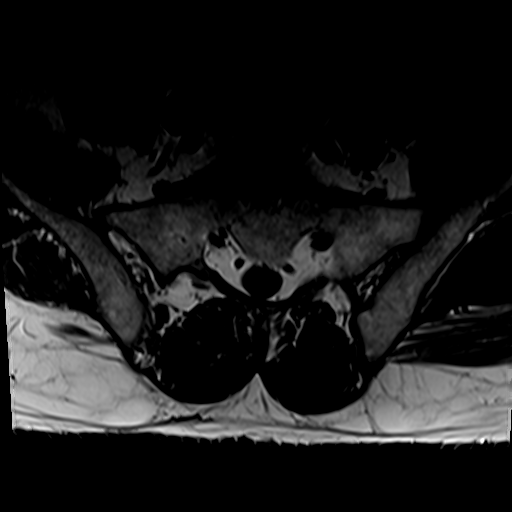
[im 6/39]
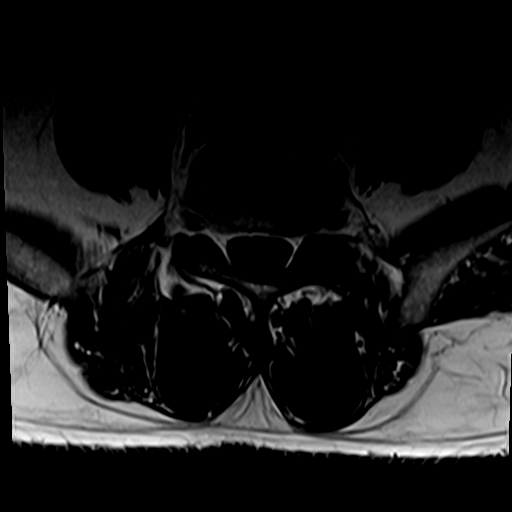
[im 11/39]
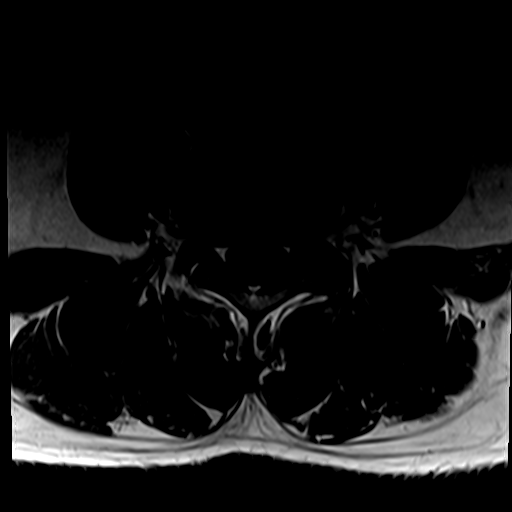
[im 17/39]
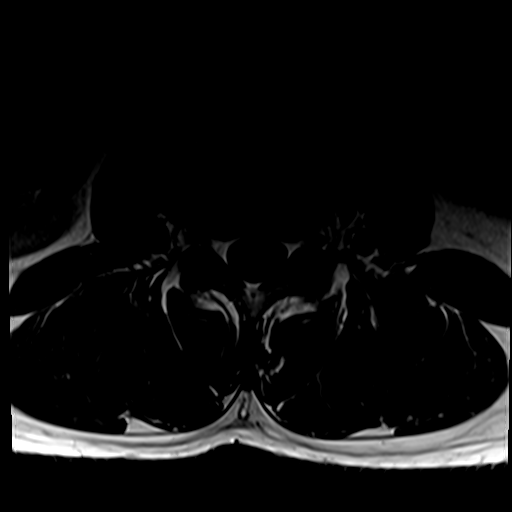
[im 20/39]
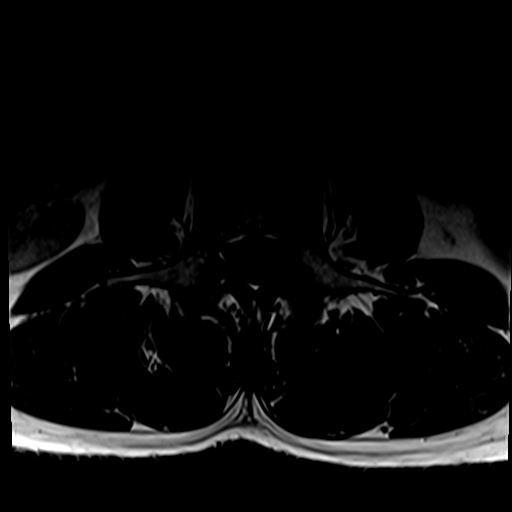
[im 22/39]
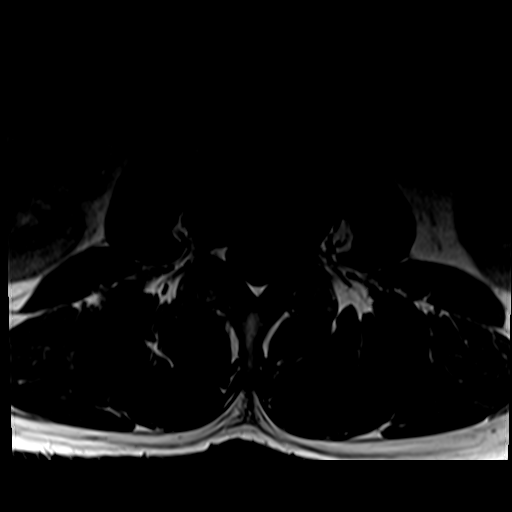
[im 28/39]
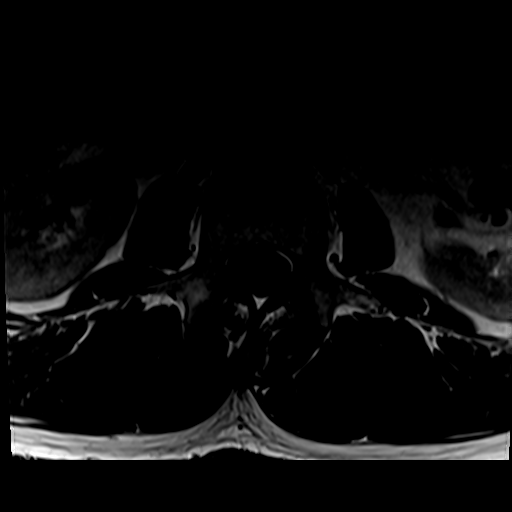
[im 33/39]
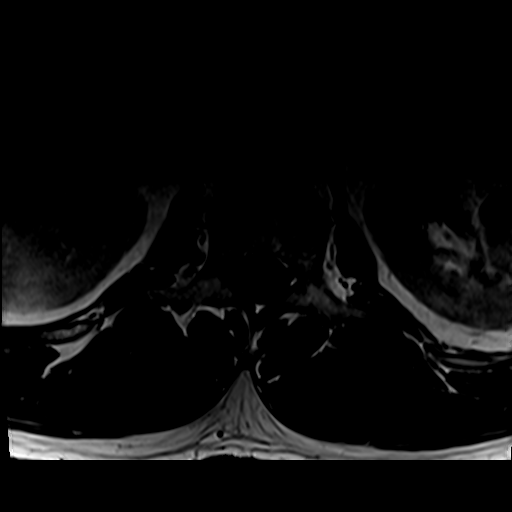
[im 39/39]
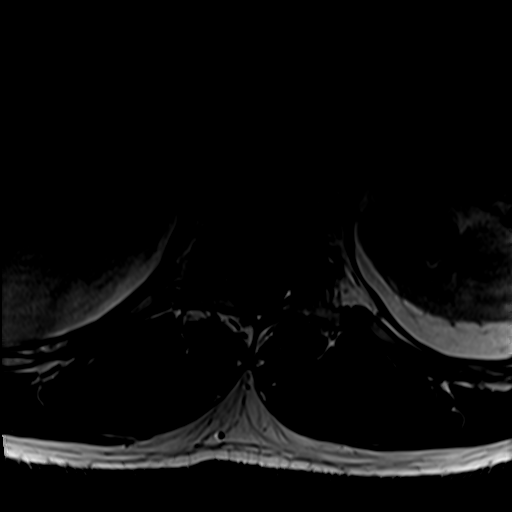

[31 of 48 positions shown; findings below may reference images not displayed]

FINDINGS: Segmentation:  Normal

Alignment:  Normal

Vertebrae:  Normal bone marrow.  Negative for fracture or mass.

Conus medullaris and cauda equina: Conus extends to the L1 level.
Conus and cauda equina appear normal.

Paraspinal and other soft tissues: Negative for paraspinous mass or
adenopathy.

Disc levels:

L1-2: Mild disc degeneration. Shallow left-sided disc protrusion
extending lateral to the foramen with associated spurring. Negative
for neural compression

L2-3: Mild disc and mild facet degeneration.  Negative for stenosis

L3-4: Mild disc degeneration. Shallow extraforaminal disc protrusion
on the right with mild displacement of the right L3 nerve root. No
significant neural compression. Spinal canal normal in size. Mild
facet degeneration

L4-5: Disc degeneration with disc bulging and mild endplate
spurring. Bilateral facet hypertrophy. Moderate subarticular
stenosis bilaterally. Mild foraminal narrowing bilaterally

L5-S1: Shallow central disc protrusion with moderate facet
degeneration bilaterally. Mild subarticular and foraminal stenosis
without neural compression.
IMPRESSION: Multilevel lumbar degenerative change as above. No fracture
identified.

## 2021-04-14 ENCOUNTER — Emergency Department: Payer: BC Managed Care – PPO

## 2021-04-14 ENCOUNTER — Encounter: Payer: Self-pay | Admitting: *Deleted

## 2021-04-14 ENCOUNTER — Other Ambulatory Visit: Payer: Self-pay

## 2021-04-14 DIAGNOSIS — Z7984 Long term (current) use of oral hypoglycemic drugs: Secondary | ICD-10-CM | POA: Diagnosis not present

## 2021-04-14 DIAGNOSIS — R0789 Other chest pain: Secondary | ICD-10-CM | POA: Insufficient documentation

## 2021-04-14 DIAGNOSIS — F17210 Nicotine dependence, cigarettes, uncomplicated: Secondary | ICD-10-CM | POA: Insufficient documentation

## 2021-04-14 DIAGNOSIS — I10 Essential (primary) hypertension: Secondary | ICD-10-CM | POA: Diagnosis not present

## 2021-04-14 DIAGNOSIS — E119 Type 2 diabetes mellitus without complications: Secondary | ICD-10-CM | POA: Insufficient documentation

## 2021-04-14 DIAGNOSIS — Z79899 Other long term (current) drug therapy: Secondary | ICD-10-CM | POA: Insufficient documentation

## 2021-04-14 DIAGNOSIS — R079 Chest pain, unspecified: Secondary | ICD-10-CM | POA: Diagnosis present

## 2021-04-14 LAB — CBC
HCT: 36.9 % — ABNORMAL LOW (ref 39.0–52.0)
Hemoglobin: 13.1 g/dL (ref 13.0–17.0)
MCH: 30.7 pg (ref 26.0–34.0)
MCHC: 35.5 g/dL (ref 30.0–36.0)
MCV: 86.4 fL (ref 80.0–100.0)
Platelets: 270 10*3/uL (ref 150–400)
RBC: 4.27 MIL/uL (ref 4.22–5.81)
RDW: 12.9 % (ref 11.5–15.5)
WBC: 9.5 10*3/uL (ref 4.0–10.5)
nRBC: 0 % (ref 0.0–0.2)

## 2021-04-14 LAB — BASIC METABOLIC PANEL
Anion gap: 7 (ref 5–15)
BUN: 15 mg/dL (ref 8–23)
CO2: 27 mmol/L (ref 22–32)
Calcium: 9 mg/dL (ref 8.9–10.3)
Chloride: 101 mmol/L (ref 98–111)
Creatinine, Ser: 1.2 mg/dL (ref 0.61–1.24)
GFR, Estimated: >60 mL/min (ref >60)
Glucose, Bld: 242 mg/dL — ABNORMAL HIGH (ref 70–99)
Potassium: 3.9 mmol/L (ref 3.5–5.1)
Sodium: 135 mmol/L (ref 135–145)

## 2021-04-14 LAB — TROPONIN I (HIGH SENSITIVITY): Troponin I (High Sensitivity): 8 ng/L (ref <18)

## 2021-04-14 IMAGING — CR DG CHEST 2V
2 series · 2 of 2 positions shown · non-contrast
Comparison: [DATE]

CLINICAL DATA: Left-sided chest pain and shortness of breath

EXAM:
CHEST - 2 VIEW

[chest pa]
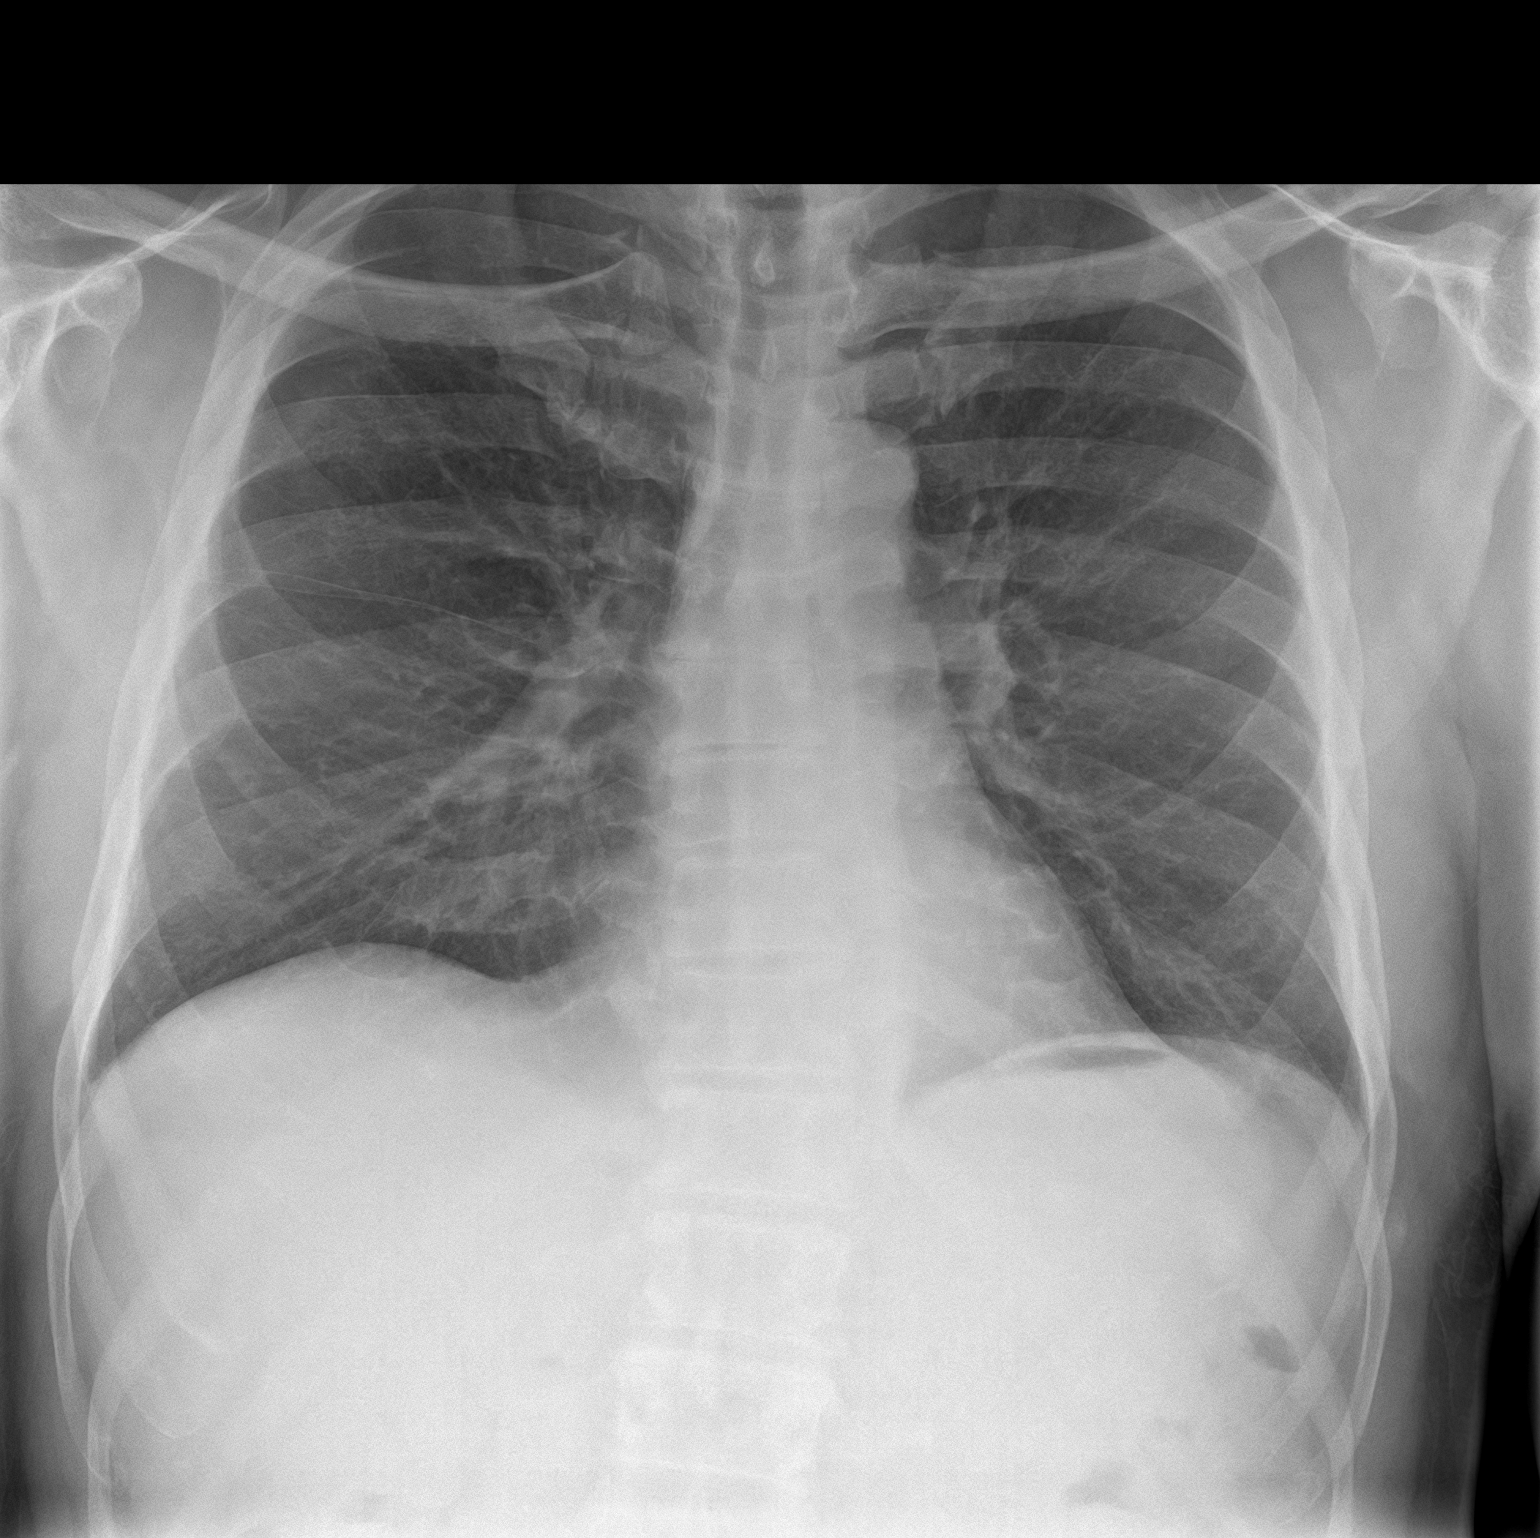

[chest lat]
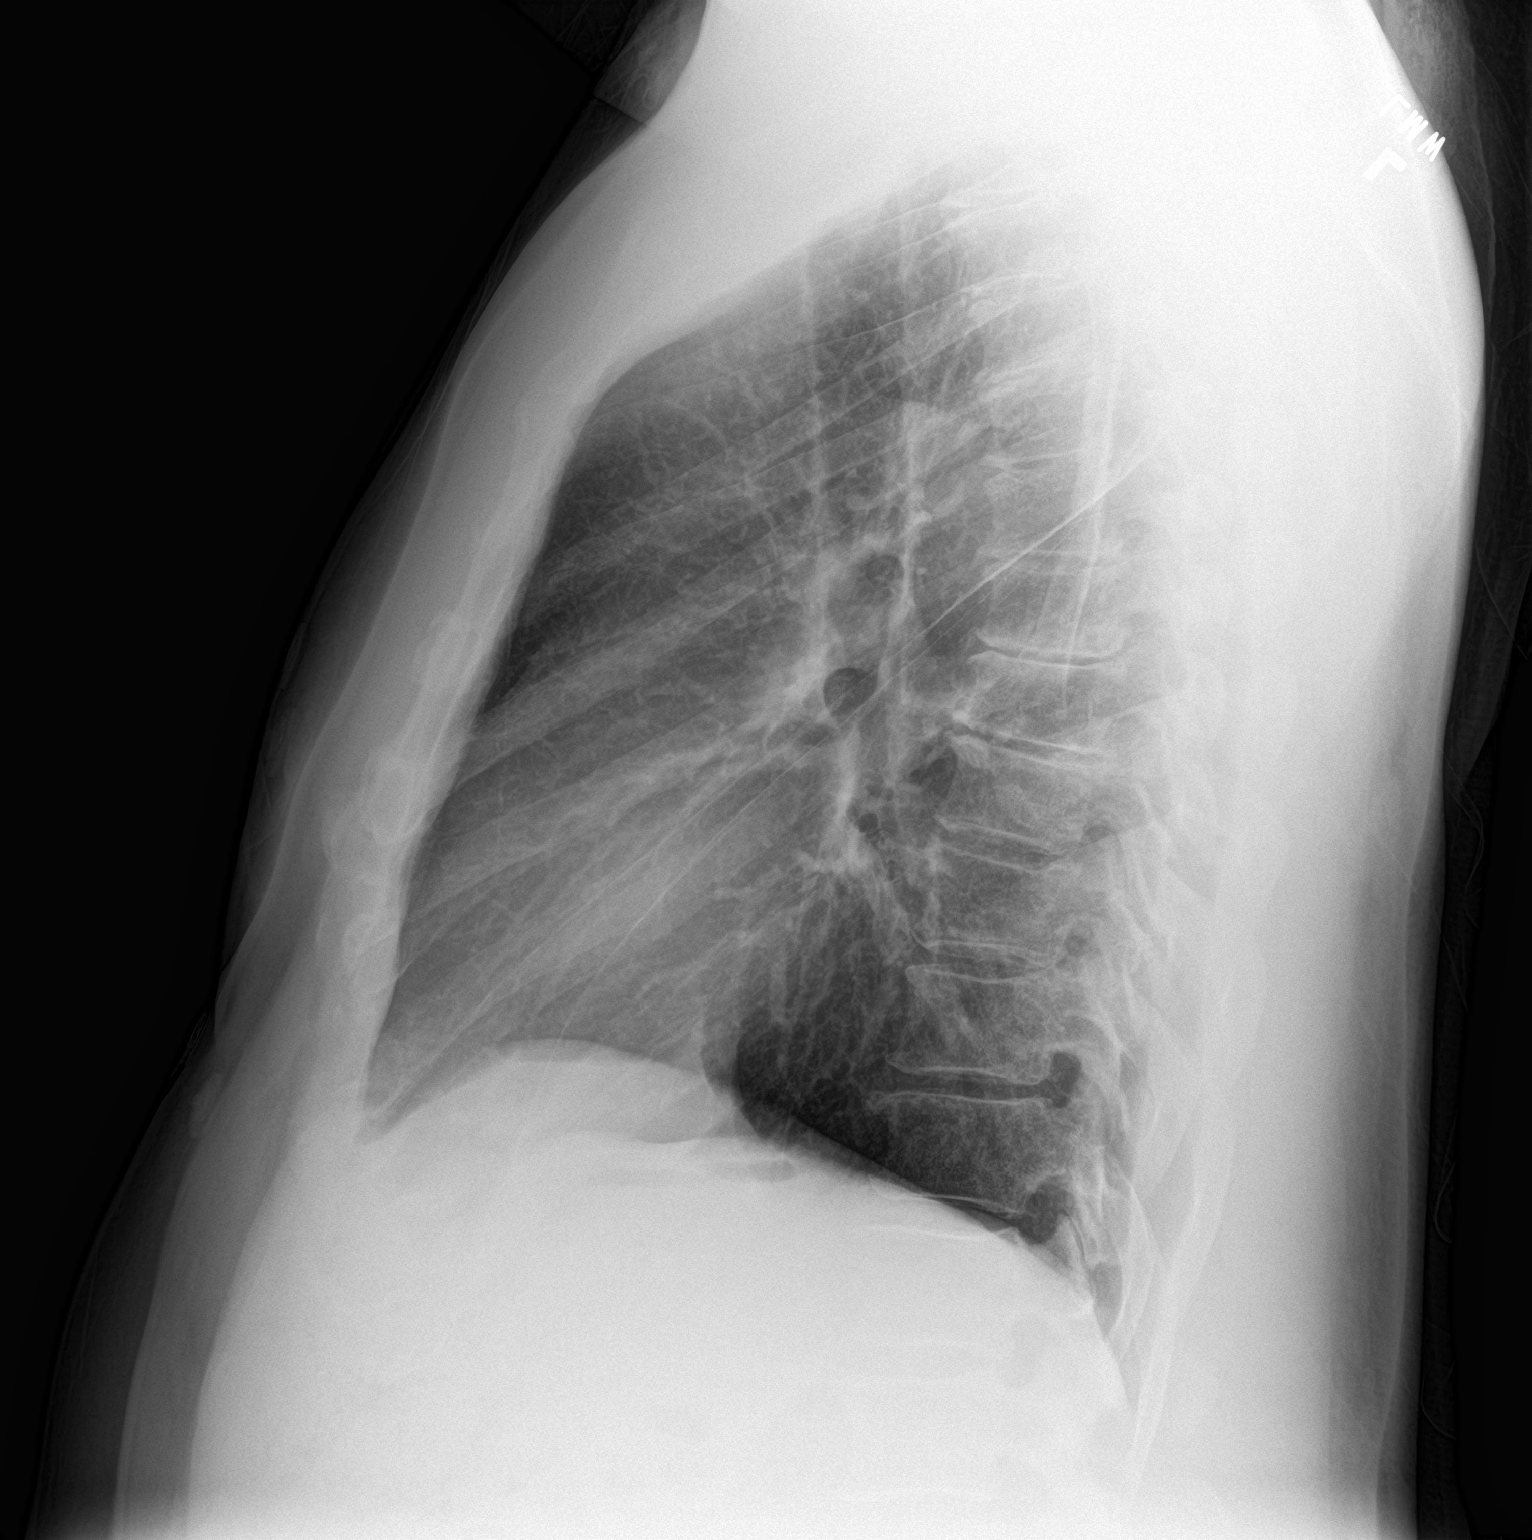

[2 of 2 positions shown; findings below may reference images not displayed]

FINDINGS: Cardiac shadow is within normal limits. Lungs are well aerated
bilaterally. Mild apical scarring is again seen and stable. No
sizable infiltrate or effusion is noted. No bony abnormality is
seen.
IMPRESSION: No acute abnormality noted.

## 2021-04-14 NOTE — ED Triage Notes (Signed)
Left sided chest pain and SOB, started today while at work. Pain is worse with breathing and movement.

## 2021-04-15 ENCOUNTER — Emergency Department
Admission: EM | Admit: 2021-04-15 | Discharge: 2021-04-15 | Disposition: A | Discharge status: Home / Self Care | Payer: BC Managed Care – PPO | Attending: Emergency Medicine | Admitting: Emergency Medicine

## 2021-04-15 DIAGNOSIS — R0789 Other chest pain: Secondary | ICD-10-CM

## 2021-04-15 LAB — TROPONIN I (HIGH SENSITIVITY): Troponin I (High Sensitivity): 7 ng/L (ref <18)

## 2021-04-15 NOTE — Discharge Instructions (Addendum)
You have been seen in the Emergency Department (ED) today for chest pain.  As we have discussed today's test results are normal, and we believe your pain is due to pain/strain and/or inflammation of the muscles and/or cartilage of your chest wall.  We recommend you take ibuprofen 600 mg three times a day with meals for the next 5 days (unless you have been told previously not to take ibuprofen or NSAIDs in general).  You may also take Tylenol according to the label instructions.  Read through the included information for additional treatment recommendations and precautions.  Continue to take your regular medications.   Return to the Emergency Department (ED) if you experience any further chest pain/pressure/tightness, difficulty breathing, or sudden sweating, or other symptoms that concern you.

## 2021-04-15 NOTE — ED Provider Notes (Signed)
[facility] Emergency Department Provider Note  ____________________________________________   Event Date/Time   First MD Initiated Contact with Patient 04/15/21 0101     (approximate)  I have reviewed the triage vital signs and the nursing notes.   HISTORY  Chief Complaint Chest Pain    HPI Joseph Daugherty is a 60 y.o. male with medical history as listed below who presents for evaluation of a cute onset left-sided chest pain that occurred while he was driving today.  It has been persistent since that time.  It is moderate in severity, aching and sometimes sharp, and is reproducible when he pushes on his chest or when he moves his left arm.  He remembers a couple of days ago lifting something heavy but he has not had any pain until this afternoon.  He denies fever, sore throat, shortness of breath, nausea, vomiting, sweating episodes, abdominal pain, and dysuria.  He has not had similar pain in the past.  Nothing in particular seems to make it better.     Past Medical History:  Diagnosis Date   Diabetes mellitus without complication ([place])    Hyperlipidemia    Hypertension     Patient Active Problem List   Diagnosis Date Noted   Acute pain of right shoulder 01/07/2020   Allergic rhinitis 05/26/2019   ED (erectile dysfunction) of organic origin 05/26/2019   BPH (benign prostatic hyperplasia) 10/21/2018   Chronic obstructive pulmonary disease ([place]) 01/15/2018   Nicotine dependence 01/15/2018   Abnormal EKG 04/11/2017   Type 2 diabetes mellitus without complication, without long-term current use of insulin ([place]) 04/11/2017   CAP (community acquired pneumonia) 10/20/2010   Current smoker 10/20/2010   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 Authorizing Provider  amLODipine (NORVASC) 10 MG tablet Take 1 tablet by mouth daily. 01/07/20   [provider]  atenolol (TENORMIN) 50 MG tablet Take 50 mg by mouth daily.    [provider]  cyclobenzaprine (FLEXERIL) 5 MG tablet Take by mouth. 01/07/20   [provider]  dapagliflozin propanediol (FARXIGA) 10 MG TABS tablet Take 10 mg by mouth daily.    [provider]  finasteride (PROSCAR) 5 MG tablet Take 1 tablet (5 mg total) by mouth daily. 12/10/20   Stoioff, Ronda Fairly, MD  Fluticasone-Salmeterol (ADVAIR) 100-50 MCG/DOSE AEPB Inhale into the lungs. 01/07/20   [provider]  glipiZIDE (GLUCOTROL XL) 5 MG 24 hr tablet Take 5 mg by mouth daily. 05/28/20   [provider]  ibuprofen (ADVIL,MOTRIN) 600 MG tablet Take 1 tablet (600 mg total) by mouth every 6 (six) hours as needed. 09/07/18   Arta Silence, MD  lisinopril (PRINIVIL,ZESTRIL) 40 MG tablet Take 40 mg by mouth daily.    [provider]  lisinopril-hydrochlorothiazide (ZESTORETIC) 20-12.5 MG tablet Take 1 tablet by mouth daily. 05/28/20   [provider]  meloxicam (MOBIC) 15 MG tablet Take 15 mg by mouth daily.    [provider]  metFORMIN (GLUCOPHAGE) 1000 MG tablet Take 1,000 mg by mouth 2 (two) times daily. 05/28/20   [provider]  ondansetron (ZOFRAN ODT) 8 MG disintegrating tablet Take 1 tablet (8 mg total) by mouth every 8 (eight) hours as needed for nausea or vomiting.  08/31/20   Arta Silence, MD  rosuvastatin (CRESTOR) 10 MG tablet Take 10 mg by mouth daily.    [provider]  sildenafil (VIAGRA) 100 MG tablet TAKE (1) TABLET BY MOUTH 1 HOUR PRIOR TO SEXUAL ACTIVITY. ~MAX OF 1 TAB PER DAY~ 12/10/19   [provider]  sitaGLIPtin-metformin (JANUMET) 50-1000 MG tablet Take 1 tablet by mouth 2 (two) times daily with a meal.    [provider]  tamsulosin (FLOMAX)  0.4 MG CAPS capsule Take 1 capsule (0.4 mg total) by mouth daily. 12/10/20   [name], MD    Allergies Penicillins  No family history on file.  Social History Social History   Tobacco Use   Smoking status: Every Day    Packs/day: 0.25    Types: Cigarettes   Smokeless tobacco: Never  Substance Use Topics   Alcohol use: Never   Drug use: Not Currently    Review of Systems Constitutional: No fever/chills Eyes: No visual changes. ENT: No sore throat. Cardiovascular: Left-sided chest pain as described above. Respiratory: Denies shortness of breath. Gastrointestinal: No abdominal pain.  No nausea, no vomiting.  No diarrhea.  No constipation. Genitourinary: Negative for dysuria. Musculoskeletal: Negative for neck pain.  Negative for back pain. Integumentary: Negative for rash. Neurological: Negative for headaches, focal weakness or numbness.   ____________________________________________   PHYSICAL EXAM:  VITAL SIGNS: ED Triage Vitals  Enc Vitals Group     BP 04/14/21 2140 (!) 156/92     Pulse Rate 04/14/21 2140 87     Resp 04/14/21 2140 18     Temp 04/14/21 2140 98.1 F (36.7 C)     Temp Source 04/14/21 2140 Oral     SpO2 04/14/21 2140 99 %     Weight --      Height --      Head Circumference --      Peak Flow --      Pain Score 04/14/21 2143 10     Pain Loc --      Pain Edu? --      Excl. in [place]? --     Constitutional: Alert and oriented.  Eyes: Conjunctivae are normal.  Head: Atraumatic. Nose: No congestion/rhinnorhea. Mouth/Throat: Patient is wearing a mask. Neck: No stridor.  No meningeal signs.   Cardiovascular: Normal rate, regular rhythm. Good peripheral circulation. Respiratory: Normal respiratory effort.  No retractions. Gastrointestinal: Soft and nontender. No distention.  Musculoskeletal: Highly reproducible left-sided chest wall tenderness to palpation and tenderness reproducible with movement of his left arm and shoulder. No gross  deformities of extremities. Neurologic:  Normal speech and language. No gross focal neurologic deficits are appreciated.  Skin:  Skin is warm, dry and intact. Psychiatric: Mood and affect are normal. Speech and behavior are normal.  ____________________________________________   LABS (all labs ordered are listed, but only abnormal results are displayed)  Labs Reviewed  BASIC METABOLIC PANEL - Abnormal; Notable for the following components:      Result Value   Glucose, Bld 242 (*)    All other components within normal limits  CBC - Abnormal; Notable for the following components:   HCT 36.9 (*)    All other components within normal limits  TROPONIN I (HIGH SENSITIVITY)  TROPONIN I (HIGH SENSITIVITY)   ____________________________________________  EKG  ED ECG REPORT I, Hinda Kehr, the attending physician, personally viewed and interpreted this ECG.  Date: 04/14/2021 EKG Time: 21: 42 Rate: 87 Rhythm: normal sinus rhythm QRS Axis: normal Intervals:  normal ST/T Wave abnormalities: normal Narrative Interpretation: no evidence of acute ischemia  ____________________________________________  RADIOLOGY I, Hinda Kehr, personally viewed and evaluated these images (plain radiographs) as part of my medical decision making, as well as reviewing the written report by the radiologist.  ED MD interpretation: No acute abnormalities  Official radiology report(s): DG Chest 2 View  Result Date: 04/14/2021 CLINICAL DATA:  Left-sided chest pain and shortness of breath EXAM: CHEST - 2 VIEW COMPARISON:  09/07/2018 FINDINGS: Cardiac shadow is within normal limits. Lungs are well aerated bilaterally. Mild apical scarring is again seen and stable. No sizable infiltrate or effusion is noted. No bony abnormality is seen. IMPRESSION: No acute abnormality noted. Electronically Signed   By: Inez Catalina M.D.   On: 04/14/2021 22:13    ____________________________________________   INITIAL  IMPRESSION / MDM / ASSESSMENT AND PLAN / ED COURSE  As part of my medical decision making, I reviewed the following data within the [place] notes reviewed and incorporated, Labs reviewed , EKG interpreted , Old chart reviewed, Radiograph reviewed , and Notes from prior ED visits   Differential diagnosis includes, but is not limited to, musculoskeletal strain, costochondritis, ACS, PE.  Patient has a well score for PE is 0 and highly reproducible chest wall tenderness to palpation and with movement of his left arm.  He also lifted something heavy even though it was a couple days ago.  He has a nonischemic EKG, 2 high-sensitivity troponins that are within normal limits, and normal basic metabolic panel and CBC.  His HEART score is 3 and his exam is reassuring.  I provided reassurance and encouraged use of ibuprofen 600 mg 3 times a day with meals and close follow-up with his PCP.  He understands and agrees with the plan.  I gave my usual and customary return precautions.     ____________________________________________  FINAL CLINICAL IMPRESSION(S) / ED DIAGNOSES  Final diagnoses:  Chest wall pain     MEDICATIONS GIVEN DURING THIS VISIT:  Medications - No data to display   ED Discharge Orders     None        Note:  This document was prepared using Dragon voice recognition software and may include unintentional dictation errors.   Hinda Kehr, MD 04/15/21 [phone]

## 2021-06-06 ENCOUNTER — Emergency Department: Payer: BC Managed Care – PPO

## 2021-06-06 ENCOUNTER — Emergency Department
Admission: EM | Admit: 2021-06-06 | Discharge: 2021-06-07 | Disposition: A | Discharge status: Home / Self Care | Payer: BC Managed Care – PPO | Attending: Emergency Medicine | Admitting: Emergency Medicine

## 2021-06-06 ENCOUNTER — Other Ambulatory Visit: Payer: Self-pay

## 2021-06-06 DIAGNOSIS — M545 Low back pain, unspecified: Secondary | ICD-10-CM | POA: Insufficient documentation

## 2021-06-06 DIAGNOSIS — Z5321 Procedure and treatment not carried out due to patient leaving prior to being seen by health care provider: Secondary | ICD-10-CM | POA: Insufficient documentation

## 2021-06-06 DIAGNOSIS — R072 Precordial pain: Secondary | ICD-10-CM | POA: Insufficient documentation

## 2021-06-06 DIAGNOSIS — C61 Malignant neoplasm of prostate: Secondary | ICD-10-CM

## 2021-06-06 LAB — COMPREHENSIVE METABOLIC PANEL
ALT: 19 U/L (ref 0–44)
AST: 18 U/L (ref 15–41)
Albumin: 4.5 g/dL (ref 3.5–5.0)
Alkaline Phosphatase: 52 U/L (ref 38–126)
Anion gap: 8 (ref 5–15)
BUN: 12 mg/dL (ref 8–23)
CO2: 27 mmol/L (ref 22–32)
Calcium: 9.1 mg/dL (ref 8.9–10.3)
Chloride: 101 mmol/L (ref 98–111)
Creatinine, Ser: 0.97 mg/dL (ref 0.61–1.24)
GFR, Estimated: >60 mL/min (ref >60)
Glucose, Bld: 306 mg/dL — ABNORMAL HIGH (ref 70–99)
Potassium: 3.9 mmol/L (ref 3.5–5.1)
Sodium: 136 mmol/L (ref 135–145)
Total Bilirubin: 0.6 mg/dL (ref 0.3–1.2)
Total Protein: 7.4 g/dL (ref 6.5–8.1)

## 2021-06-06 LAB — CBC WITH DIFFERENTIAL/PLATELET
Abs Immature Granulocytes: 0.04 10*3/uL (ref 0.00–0.07)
Basophils Absolute: 0.1 10*3/uL (ref 0.0–0.1)
Basophils Relative: 1 %
Eosinophils Absolute: 0.2 10*3/uL (ref 0.0–0.5)
Eosinophils Relative: 2 %
HCT: 39.7 % (ref 39.0–52.0)
Hemoglobin: 13.4 g/dL (ref 13.0–17.0)
Immature Granulocytes: 0 %
Lymphocytes Relative: 45 %
Lymphs Abs: 4.5 10*3/uL — ABNORMAL HIGH (ref 0.7–4.0)
MCH: 29.2 pg (ref 26.0–34.0)
MCHC: 33.8 g/dL (ref 30.0–36.0)
MCV: 86.5 fL (ref 80.0–100.0)
Monocytes Absolute: 0.8 10*3/uL (ref 0.1–1.0)
Monocytes Relative: 8 %
Neutro Abs: 4.5 10*3/uL (ref 1.7–7.7)
Neutrophils Relative %: 44 %
Platelets: 282 10*3/uL (ref 150–400)
RBC: 4.59 MIL/uL (ref 4.22–5.81)
RDW: 12.5 % (ref 11.5–15.5)
WBC: 10.1 10*3/uL (ref 4.0–10.5)
nRBC: 0 % (ref 0.0–0.2)

## 2021-06-06 LAB — TROPONIN I (HIGH SENSITIVITY): Troponin I (High Sensitivity): 8 ng/L (ref <18)

## 2021-06-06 IMAGING — CR DG CHEST 2V
1 series · 2 of 2 positions shown · non-contrast
Comparison: [DATE]

CLINICAL DATA: Chest pain

EXAM:
CHEST - 2 VIEW

[Series 1: dg chest 2 view · 0.14mm/px · 2 of 2 slices shown]
[im 1/2]
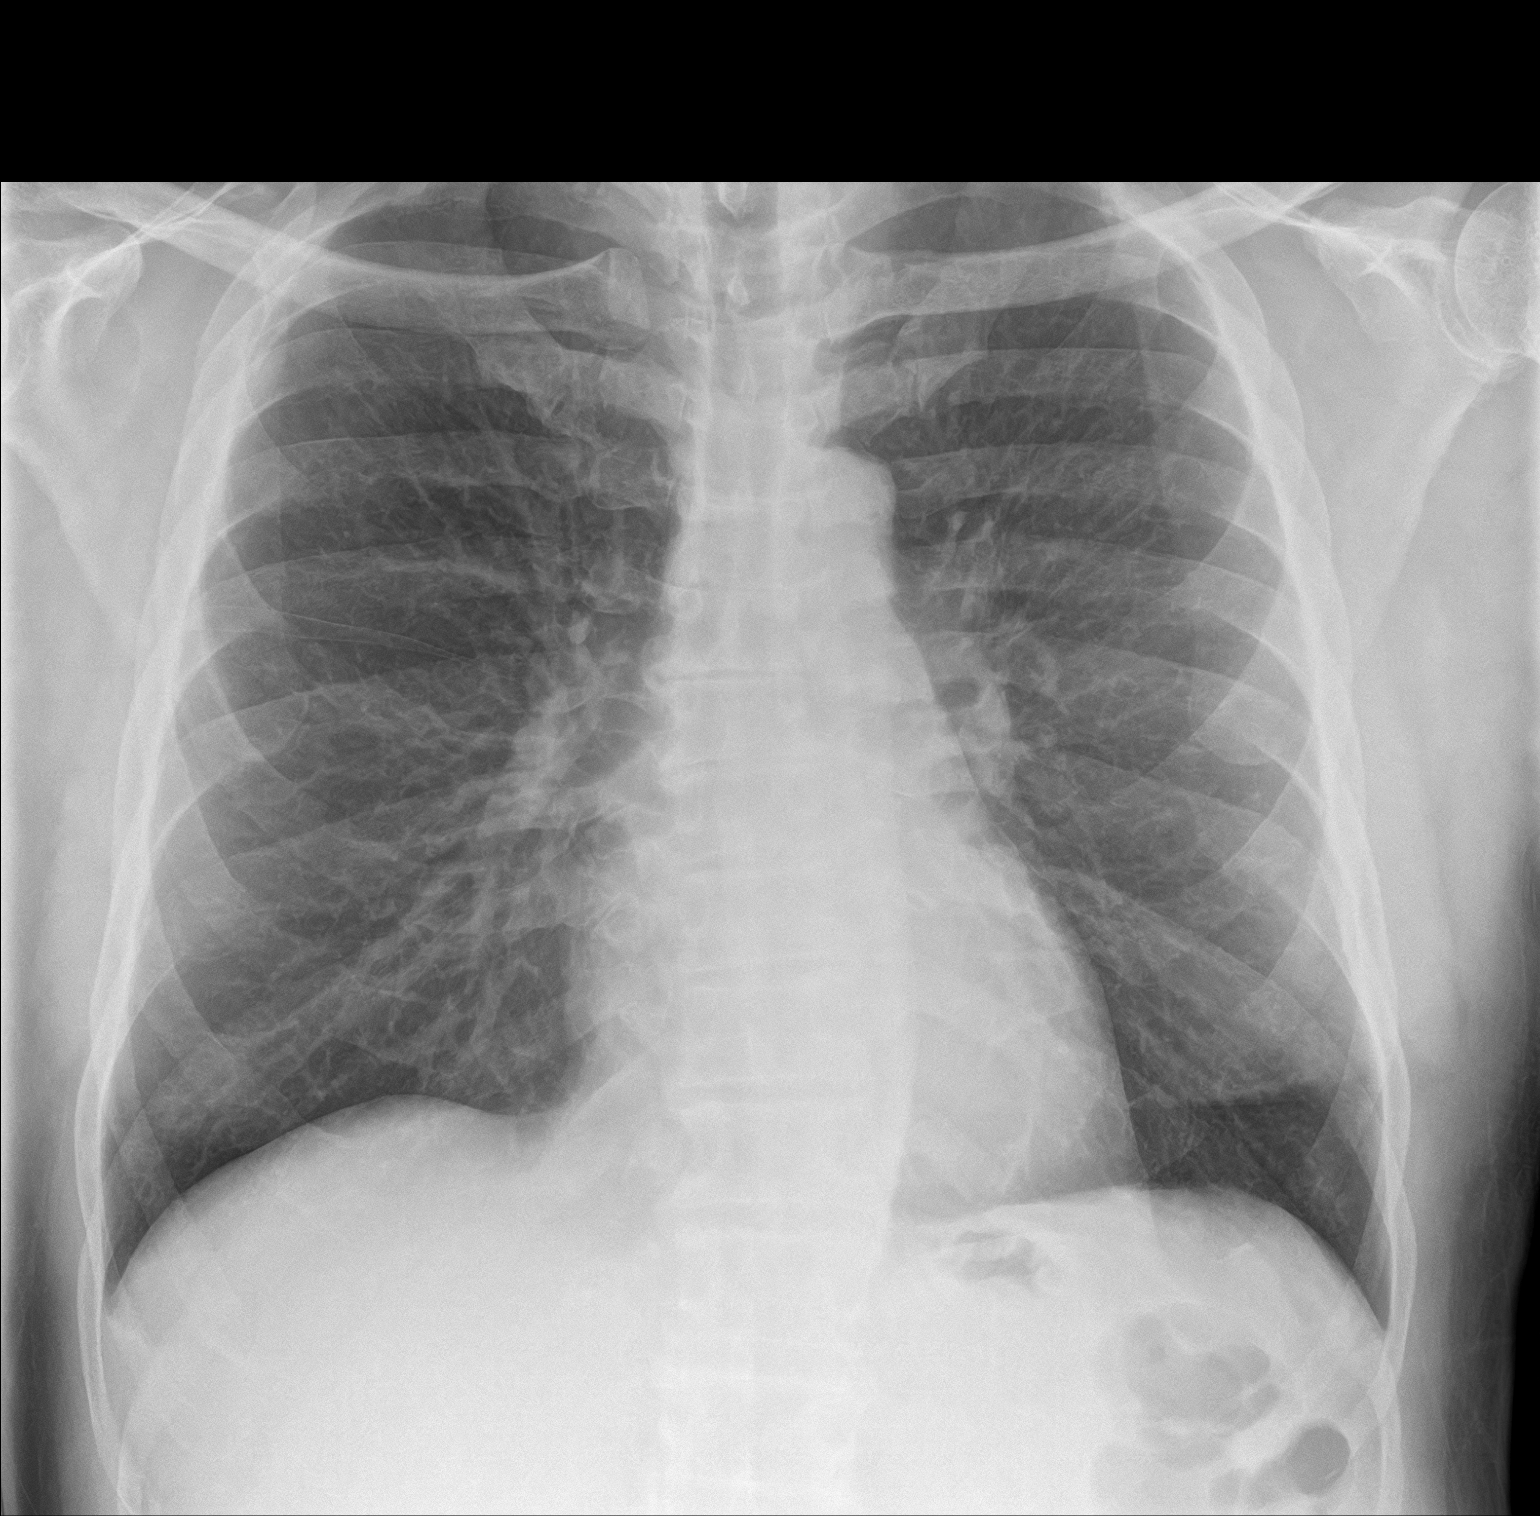
[im 2/2]
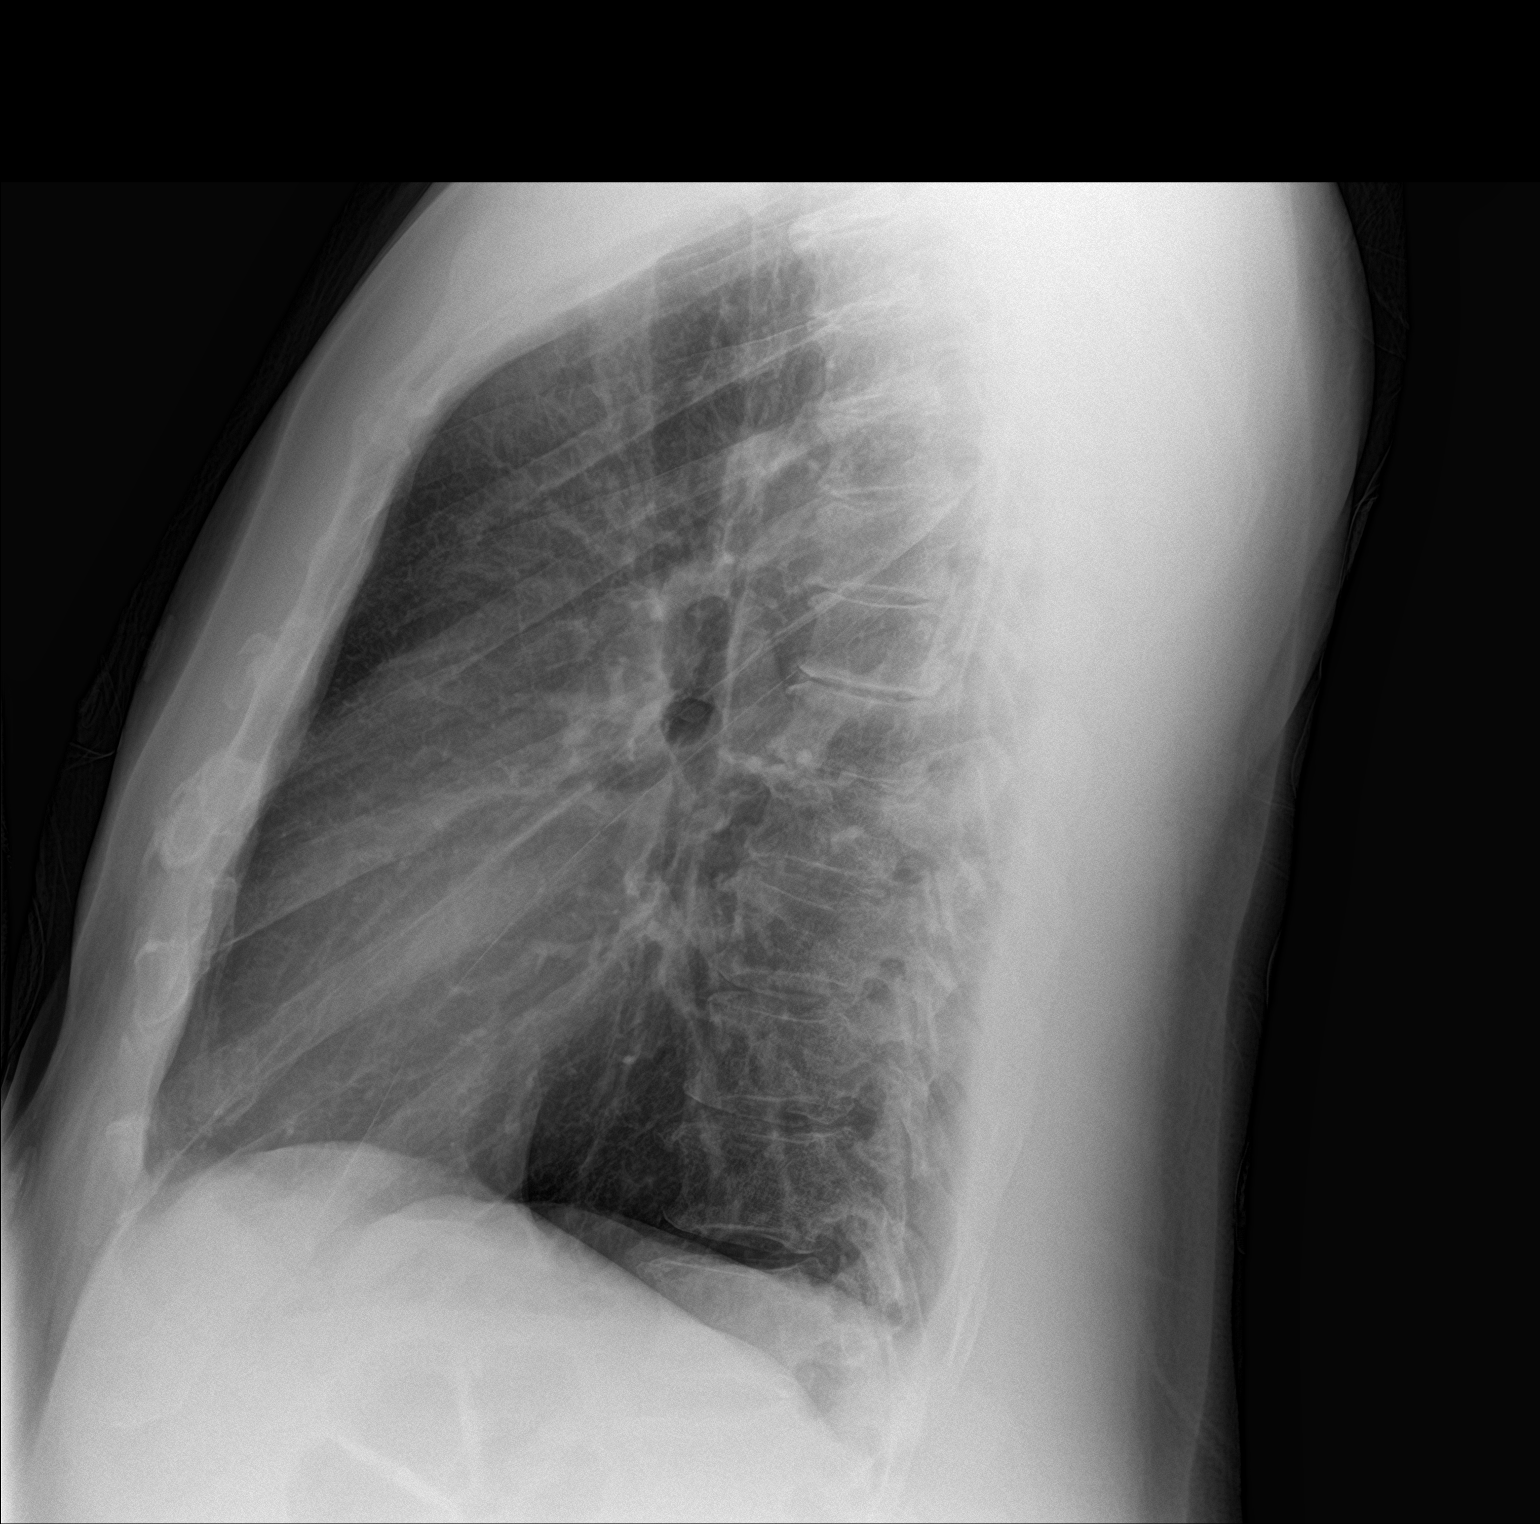

[2 of 2 positions shown; findings below may reference images not displayed]

FINDINGS: The heart size and mediastinal contours are within normal limits.
Both lungs are clear. The visualized skeletal structures are
unremarkable.
IMPRESSION: No active cardiopulmonary disease.

## 2021-06-06 NOTE — ED Triage Notes (Signed)
Pt comes with c/o CP mid sternal. Pt states it started yesterday and has gotten worse. Pt states some back dull pain.

## 2021-06-06 NOTE — ED Provider Notes (Signed)
Emergency Medicine Provider Triage Evaluation Note  Joseph Daugherty , a 60 y.o. male  was evaluated in triage.  Pt complains of chest pain.  Patient states that he has pain radiating from the center of his chest to his back.  Describes as a sharp sensation.  Does not appear to be pleuritic in nature.  Has had no shortness of breath, no URI symptoms, no fevers or chills.  He denies any GI complaints.  No history of GERD.  No cardiac history..  Review of Systems  Positive: Chest pain Negative: Fevers, URI symptoms, shortness of breath, cough, GI complaints, urinary symptoms  Physical Exam  BP (!) 167/107 (BP Location: Left Arm)   Pulse 90   Temp 98.4 F (36.9 C) (Oral)   Resp 18   SpO2 95%  Gen:   Awake, no distress   Resp:  Normal effort  MSK:   Moves extremities without difficulty  Other:  Adventitious lung sounds.  Medical Decision Making  Medically screening exam initiated at 5:42 PM.  Appropriate orders placed.  Joseph Daugherty was informed that the remainder of the evaluation will be completed by another provider, this initial triage assessment does not replace that evaluation, and the importance of remaining in the ED until their evaluation is complete.  Patient presented with chest pain.  No cardiac history.  No URI symptoms.  States that it is a sharp sensation running from his center of his chest to his back.  Patient will have labs, EKG, chest x-ray   Darletta Moll, PA-C 06/06/21 1744    Vladimir Crofts, MD 06/06/21 2318

## 2021-06-13 ENCOUNTER — Other Ambulatory Visit: Payer: BC Managed Care – PPO

## 2021-06-13 ENCOUNTER — Other Ambulatory Visit: Payer: Self-pay

## 2021-06-13 DIAGNOSIS — C61 Malignant neoplasm of prostate: Secondary | ICD-10-CM

## 2021-06-14 LAB — PSA: Prostate Specific Ag, Serum: 1.9 ng/mL (ref 0.0–4.0)

## 2021-06-15 ENCOUNTER — Ambulatory Visit (INDEPENDENT_AMBULATORY_CARE_PROVIDER_SITE_OTHER): Payer: BC Managed Care – PPO | Admitting: Urology

## 2021-06-15 ENCOUNTER — Encounter: Payer: Self-pay | Admitting: Urology

## 2021-06-15 ENCOUNTER — Other Ambulatory Visit: Payer: Self-pay

## 2021-06-15 VITALS — BP 130/84 | HR 82 | Ht 76.0 in | Wt 247.0 lb

## 2021-06-15 DIAGNOSIS — C61 Malignant neoplasm of prostate: Secondary | ICD-10-CM

## 2021-06-15 DIAGNOSIS — N401 Enlarged prostate with lower urinary tract symptoms: Secondary | ICD-10-CM

## 2021-06-15 MED ORDER — FINASTERIDE 5 MG PO TABS: 5.0000 mg | ORAL_TABLET | Freq: Every day | ORAL | 3 refills | Status: DC

## 2021-06-15 MED ORDER — TAMSULOSIN HCL 0.4 MG PO CAPS: 0.4000 mg | ORAL_CAPSULE | Freq: Every day | ORAL | 3 refills | Status: DC

## 2021-06-15 NOTE — Progress Notes (Signed)
06/15/2021 1:12 PM   Joseph Daugherty [birth year] [id number]  Referring provider: [place],  [place] [address]  Chief Complaint  Patient presents with   Prostate Cancer    Urologic history:   1.  T1c low risk prostate cancer -Dx 01/2020; PSA 6.7; 72 g prostate -2 cores Gleason 3+3 < 10%   2.  BPH with LUTS -On tamsulosin/finasteride -Finasteride started 02/2020   3.  Erectile dysfunction  HPI: 60 y.o. male presents for semiannual follow-up.  Doing well since last visit No bothersome LUTS Denies dysuria, gross hematuria Denies flank, abdominal or pelvic pain Uncorrected PSA 06/13/2021 1.9  PMH: Past Medical History:  Diagnosis Date   Diabetes mellitus without complication ([place])    Hyperlipidemia    Hypertension     Surgical History: Past Surgical History:  Procedure Laterality Date   PROSTATE BIOPSY  01/30/2020    Home Medications:  Allergies as of 06/15/2021       Reactions   Penicillins Hives, Anaphylaxis        Medication List        Accurate as of June 15, 2021  1:12 PM. If you have any questions, ask your nurse or doctor.          amLODipine 10 MG tablet Commonly known as: NORVASC Take 1 tablet by mouth daily.   atenolol 50 MG tablet Commonly known as: TENORMIN Take 50 mg by mouth daily.   cyclobenzaprine 5 MG tablet Commonly known as: FLEXERIL Take by mouth.   dapagliflozin propanediol 10 MG Tabs tablet Commonly known as: FARXIGA Take 10 mg by mouth daily.   finasteride 5 MG tablet Commonly known as: Proscar Take 1 tablet (5 mg total) by mouth daily.   Fluticasone-Salmeterol 100-50 MCG/DOSE Aepb Commonly known as: ADVAIR Inhale into the lungs.   glipiZIDE 5 MG 24 hr tablet Commonly known as: GLUCOTROL XL Take 5 mg by mouth daily.   ibuprofen 600 MG tablet Commonly known as: ADVIL Take 1 tablet (600 mg total) by mouth every 6 (six) hours as needed.   lisinopril 40 MG  tablet Commonly known as: ZESTRIL Take 40 mg by mouth daily.   lisinopril-hydrochlorothiazide 20-12.5 MG tablet Commonly known as: ZESTORETIC Take 1 tablet by mouth daily.   meloxicam 15 MG tablet Commonly known as: MOBIC Take 15 mg by mouth daily.   metFORMIN 1000 MG tablet Commonly known as: GLUCOPHAGE Take 1,000 mg by mouth 2 (two) times daily.   ondansetron 8 MG disintegrating tablet Commonly known as: Zofran ODT Take 1 tablet (8 mg total) by mouth every 8 (eight) hours as needed for nausea or vomiting.   rosuvastatin 10 MG tablet Commonly known as: CRESTOR Take 10 mg by mouth daily.   sildenafil 100 MG tablet Commonly known as: VIAGRA TAKE (1) TABLET BY MOUTH 1 HOUR PRIOR TO SEXUAL ACTIVITY. ~MAX OF 1 TAB PER DAY~   sitaGLIPtin-metformin 50-1000 MG tablet Commonly known as: JANUMET Take 1 tablet by mouth 2 (two) times daily with a meal.   tamsulosin 0.4 MG Caps capsule Commonly known as: FLOMAX Take 1 capsule (0.4 mg total) by mouth daily.        Allergies:  Allergies  Allergen Reactions   Penicillins Hives and Anaphylaxis    Family History: No family history on file.  Social History:  reports that he has been smoking cigarettes. He has been smoking an average of .25 packs per day. He has never used smokeless tobacco. He reports that  he does not currently use drugs. He reports that he does not drink alcohol.   Physical Exam: BP 130/84   Pulse 82   Ht 6\' 4"  (1.93 m)   Wt 247 lb (112 kg)   BMI 30.07 kg/m   Constitutional:  Alert and oriented, No acute distress. HEENT: [place] AT, moist mucus membranes.  Trachea midline, no masses. Cardiovascular: No clubbing, cyanosis, or edema. Respiratory: Normal respiratory effort, no increased work of breathing. Psychiatric: Normal mood and affect.   Assessment & Plan:    T1c prostate cancer-low risk Stable PSA Desires to continue active surveillance Follow-up 6 months for PSA/DRE Discuss confirmatory biopsy  on follow-up  2.  BPH with LUTS Stable Tamsulosin and finasteride refilled   [name], MD  [facility] [address] [place], [place] [address] [phone]

## 2021-12-13 ENCOUNTER — Other Ambulatory Visit: Payer: BC Managed Care – PPO

## 2021-12-13 DIAGNOSIS — C61 Malignant neoplasm of prostate: Secondary | ICD-10-CM

## 2021-12-14 LAB — PSA: Prostate Specific Ag, Serum: 1.6 ng/mL (ref 0.0–4.0)

## 2021-12-16 ENCOUNTER — Encounter: Payer: Self-pay | Admitting: Urology

## 2021-12-16 ENCOUNTER — Ambulatory Visit (INDEPENDENT_AMBULATORY_CARE_PROVIDER_SITE_OTHER): Payer: BC Managed Care – PPO | Admitting: Urology

## 2021-12-16 VITALS — BP 135/86 | HR 84 | Ht 76.0 in | Wt 239.0 lb

## 2021-12-16 DIAGNOSIS — N401 Enlarged prostate with lower urinary tract symptoms: Secondary | ICD-10-CM

## 2021-12-16 DIAGNOSIS — Z8546 Personal history of malignant neoplasm of prostate: Secondary | ICD-10-CM | POA: Diagnosis not present

## 2021-12-16 DIAGNOSIS — C61 Malignant neoplasm of prostate: Secondary | ICD-10-CM

## 2021-12-16 DIAGNOSIS — N529 Male erectile dysfunction, unspecified: Secondary | ICD-10-CM | POA: Diagnosis not present

## 2021-12-16 MED ORDER — FINASTERIDE 5 MG PO TABS: 5.0000 mg | ORAL_TABLET | Freq: Every day | ORAL | 3 refills | Status: DC

## 2021-12-16 MED ORDER — TAMSULOSIN HCL 0.4 MG PO CAPS: 0.4000 mg | ORAL_CAPSULE | Freq: Every day | ORAL | 3 refills | Status: DC

## 2021-12-16 NOTE — Patient Instructions (Signed)
Prostate MRI Prep: ? ?1- No ejaculation 48 hours prior to exam ? ?2- No food or drink or caffeine 4 hours prior to exam ? ?3- Fleets enema needs to be done 4 hours prior to exam  ? ?4- Urinate just prior to exam  ?

## 2021-12-16 NOTE — Addendum Note (Signed)
Addended by: Tommy Rainwater on: 12/16/2021 11:46 AM   Modules accepted: Orders

## 2021-12-16 NOTE — Progress Notes (Signed)
12/16/2021 11:27 AM   Joseph Daugherty [birth year] [id number]  Referring provider: [name], MD [place],  [place] [address]  Chief Complaint  Patient presents with   Prostate Cancer    Urologic history:   1.  T1c low risk prostate cancer -Dx 01/2020; PSA 6.7; 72 g prostate -2 cores Gleason 3+3 < 10%   2.  BPH with LUTS -On tamsulosin/finasteride -Finasteride started 02/2020   3.  Erectile dysfunction  HPI: 61 y.o. male presents for semiannual follow-up.  Doing well since last visit No bothersome LUTS; remains on tamsulosin/finasteride Denies dysuria, gross hematuria Denies flank, abdominal or pelvic pain Uncorrected PSA 12/13/2021 1.6    PMH: Past Medical History:  Diagnosis Date   Diabetes mellitus without complication ([place])    Hyperlipidemia    Hypertension     Surgical History: Past Surgical History:  Procedure Laterality Date   PROSTATE BIOPSY  01/30/2020    Home Medications:  Allergies as of 12/16/2021       Reactions   Penicillins Hives, Anaphylaxis        Medication List        Accurate as of Dec 16, 2021 11:27 AM. If you have any questions, ask your nurse or doctor.          amLODipine 10 MG tablet Commonly known as: NORVASC Take 1 tablet by mouth daily.   atenolol 50 MG tablet Commonly known as: TENORMIN Take 50 mg by mouth daily.   celecoxib 200 MG capsule Commonly known as: CELEBREX Take 200 mg by mouth daily.   cyclobenzaprine 5 MG tablet Commonly known as: FLEXERIL Take by mouth.   dapagliflozin propanediol 10 MG Tabs tablet Commonly known as: FARXIGA Take 10 mg by mouth daily.   finasteride 5 MG tablet Commonly known as: Proscar Take 1 tablet (5 mg total) by mouth daily.   Fluticasone-Salmeterol 100-50 MCG/DOSE Aepb Commonly known as: ADVAIR Inhale into the lungs.   glipiZIDE 5 MG 24 hr tablet Commonly known as: GLUCOTROL XL Take 5 mg by mouth daily.   ibuprofen 600 MG  tablet Commonly known as: ADVIL Take 1 tablet (600 mg total) by mouth every 6 (six) hours as needed.   lisinopril 40 MG tablet Commonly known as: ZESTRIL Take 40 mg by mouth daily.   lisinopril-hydrochlorothiazide 20-12.5 MG tablet Commonly known as: ZESTORETIC Take 1 tablet by mouth daily.   meloxicam 15 MG tablet Commonly known as: MOBIC Take 15 mg by mouth daily.   metFORMIN 1000 MG tablet Commonly known as: GLUCOPHAGE Take 1,000 mg by mouth 2 (two) times daily.   metoprolol succinate 25 MG 24 hr tablet Commonly known as: TOPROL-XL Take 25 mg by mouth daily.   ondansetron 8 MG disintegrating tablet Commonly known as: Zofran ODT Take 1 tablet (8 mg total) by mouth every 8 (eight) hours as needed for nausea or vomiting.   rosuvastatin 10 MG tablet Commonly known as: CRESTOR Take 10 mg by mouth daily.   sildenafil 100 MG tablet Commonly known as: VIAGRA TAKE (1) TABLET BY MOUTH 1 HOUR PRIOR TO SEXUAL ACTIVITY. ~MAX OF 1 TAB PER DAY~   sitaGLIPtin-metformin 50-1000 MG tablet Commonly known as: JANUMET Take 1 tablet by mouth 2 (two) times daily with a meal.   tamsulosin 0.4 MG Caps capsule Commonly known as: FLOMAX Take 1 capsule (0.4 mg total) by mouth daily.        Allergies:  Allergies  Allergen Reactions   Penicillins Hives and Anaphylaxis  Family History: No family history on file.  Social History:  reports that he has been smoking cigarettes. He has been smoking an average of .25 packs per day. He has never used smokeless tobacco. He reports that he does not currently use drugs. He reports that he does not drink alcohol.   Physical Exam: BP 135/86   Pulse 84   Ht '6\' 4"'$  (1.93 m)   Wt 239 lb (108.4 kg)   BMI 29.09 kg/m   Constitutional:  Alert and oriented, No acute distress. HEENT: [place] AT, moist mucus membranes.  Trachea midline, no masses. Cardiovascular: No clubbing, cyanosis, or edema. Respiratory: Normal respiratory effort, no increased  work of breathing. GU: Prostate 50 g, smooth without nodules Psychiatric: Normal mood and affect.   Assessment & Plan:    T1c prostate cancer-low risk Stable PSA Desires to continue active surveillance He is approaching 2 years from his initial diagnosis.  Confirmatory biopsy was discussed Recommend MRI prior to confirmatory biopsy to evaluate for lesions suspicious for high-grade cancer  2.  BPH with LUTS Stable Continue tamsulosin and finasteride    [name], MD  [facility] [address] [place], [place] [address] [phone]

## 2022-01-05 ENCOUNTER — Ambulatory Visit
Admission: RE | Admit: 2022-01-05 | Discharge: 2022-01-05 | Disposition: A | Discharge status: Home / Self Care | Payer: BC Managed Care – PPO | Source: Ambulatory Visit | Attending: Urology | Admitting: Urology

## 2022-01-05 DIAGNOSIS — C61 Malignant neoplasm of prostate: Secondary | ICD-10-CM | POA: Diagnosis present

## 2022-01-05 IMAGING — MR MR PROSTATE WO/W CM
10 of 11 series · 46 of 48 positions shown · IV contrast (gadavist)
Comparison: None Available.

CLINICAL DATA: Low risk prostate carcinoma.  Active surveillance.

EXAM:
MR PROSTATE WITHOUT AND WITH CONTRAST
TECHNIQUE: Multiplanar multisequence MRI images were obtained of the pelvis
centered about the prostate. Pre and post contrast images were
obtained.
CONTRAST:  10mL GADAVIST GADOBUTROL 1 MMOL/ML IV SOLN

[Series 3: ax in&out whole · axial · 3.0mm · 1.19mm/px · 1 of 88 slices shown (1 of 2)]
[im 1/88]
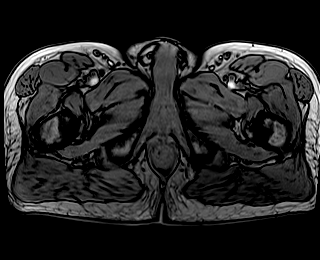

[Series 4: ax in&out whole · axial · 3.0mm · 1.19mm/px · z∈[-32,+229]mm · 2 of 88 slices shown (2 of 2)]
[im 1/88]
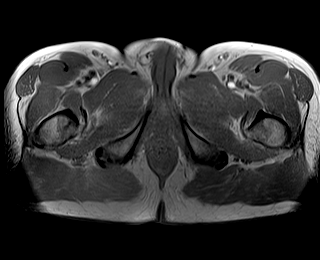
[im 88/88]
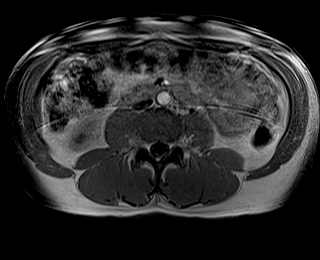

[Series 5: T2 · coronal · 3.0mm · 0.70mm/px · 1 of 35 slices shown (1 of 3)]
[im 1/35]
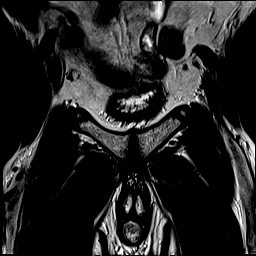

[Series 6: T2 · axial · 3.0mm · 0.56mm/px · 1 of 25 slices shown (2 of 3)]
[im 1/25]
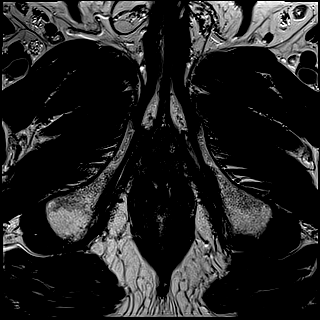

[Series 7: DWI · axial · 3.0mm · 0.86mm/px · z∈[-34,+38]mm · 2 of 75 slices shown (1 of 3)]
[im 1/75]
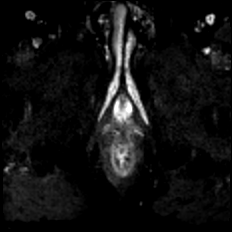
[im 75/75]
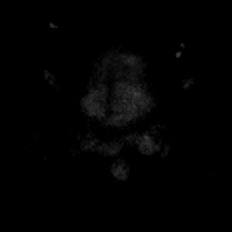

[Series 8: DWI · axial · 3.0mm · 0.86mm/px · 1 of 25 slices shown (2 of 3)]
[im 1/25]
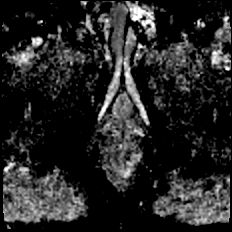

[Series 9: DWI · axial · 3.0mm · 0.86mm/px · 1 of 25 slices shown (3 of 3)]
[im 1/25]
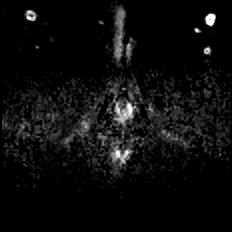

[Series 10: T2 · axial · 1.0mm · 1.04mm/px · z∈[-30,+41]mm · 2 of 72 slices shown (3 of 3)]
[im 1/72]
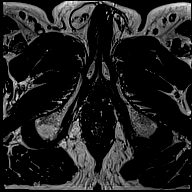
[im 72/72]
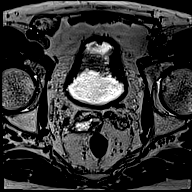

[Series 11: T1 · axial · 3.0mm · 1.15mm/px · z∈[-27,+54]mm · 18 of 700 slices shown (1 of 2)]
[im 1/700]
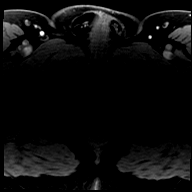
[im 42/700]
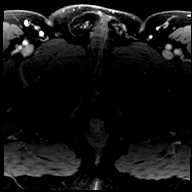
[im 83/700]
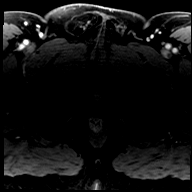
[im 124/700]
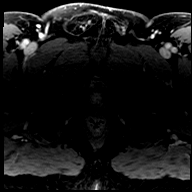
[im 165/700]
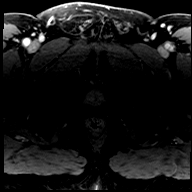
[im 206/700]
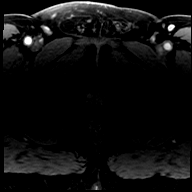
[im 247/700]
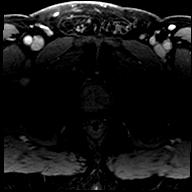
[im 288/700]
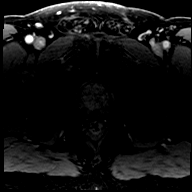
[im 329/700]
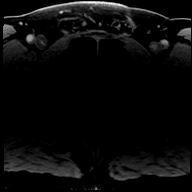
[im 371/700]
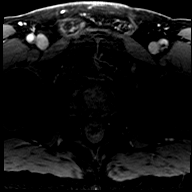
[im 412/700]
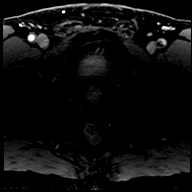
[im 453/700]
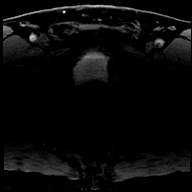
[im 494/700]
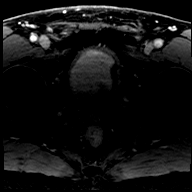
[im 535/700]
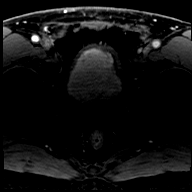
[im 576/700]
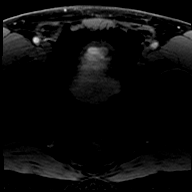
[im 617/700]
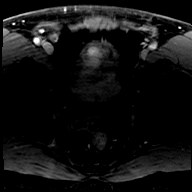
[im 658/700]
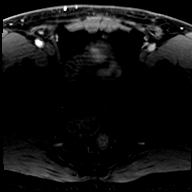
[im 700/700]
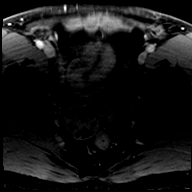

[Series 12: T1 · axial · 3.0mm · 1.15mm/px · z∈[-27,+54]mm · 17 of 672 slices shown (2 of 2)]
[im 1/672]
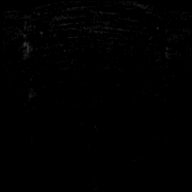
[im 42/672]
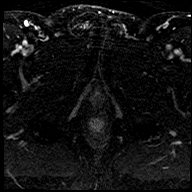
[im 84/672]
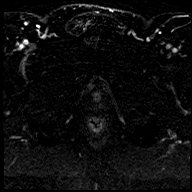
[im 126/672]
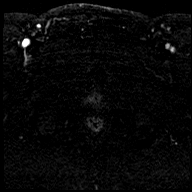
[im 168/672]
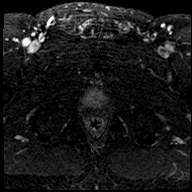
[im 210/672]
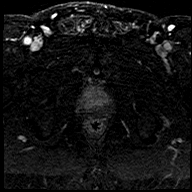
[im 252/672]
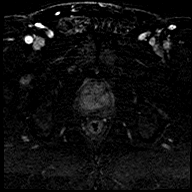
[im 294/672]
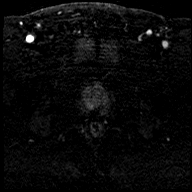
[im 336/672]
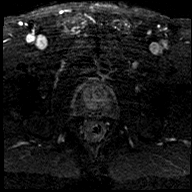
[im 378/672]
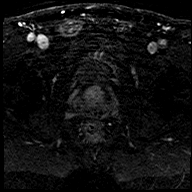
[im 420/672]
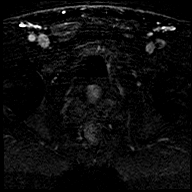
[im 462/672]
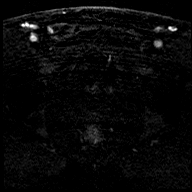
[im 504/672]
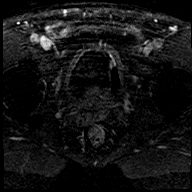
[im 546/672]
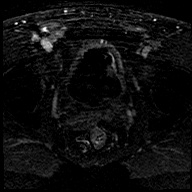
[im 588/672]
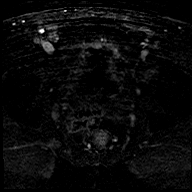
[im 630/672]
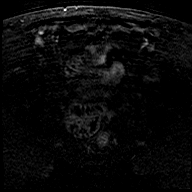
[im 672/672]
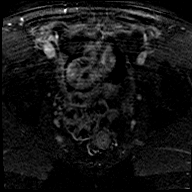

[46 of 48 positions shown; findings below may reference images not displayed]

FINDINGS: Prostate:

-- Peripheral Zone: Linear/wedge shaped hypointensities are noted on
ADC; however, no focal ADC hypointense or high b-value DWI
hyperintense nodules are identified.

-- Transition/Central Zone: Mildly enlarged with multiple BPH
nodules. No non-circumscribed or otherwise suspicious appearing
nodules identified.

-- Measurements/Volume:  4.5 by 3.9 x 4.9 cm (volume = 45 cm^3)

Transcapsular spread:  Absent

Seminal vesicle involvement:  Absent

Neurovascular bundle involvement:  Absent

Pelvic adenopathy: None visualized

Bone metastasis: None visualized

Other:  None
IMPRESSION: No radiographic evidence of high-grade prostate carcinoma. PI-RADS 2
(v.2.1): Low (clinically significant cancer unlikely)

## 2022-01-05 MED ORDER — GADOBUTROL 1 MMOL/ML IV SOLN
10.0000 mL | Freq: Once | INTRAVENOUS | Status: AC | PRN
  Administered 2022-01-05: 10 mL via INTRAVENOUS

## 2022-01-09 ENCOUNTER — Encounter: Payer: Self-pay | Admitting: Urology

## 2022-01-10 NOTE — Telephone Encounter (Signed)
Patient called and was given message per Dr. Dene Gentry my chart note. Patient was scheduled for prostate biopsy in office. Instructions discussed in detail along with stopping ASA '81mg'$  and meloxicam 7 days prior to procedure. Patient verbalized understanding. Instructions sent via mychart as well as through the mail

## 2022-01-23 ENCOUNTER — Emergency Department
Admission: EM | Admit: 2022-01-23 | Discharge: 2022-01-23 | Disposition: A | Discharge status: Home / Self Care | Payer: BC Managed Care – PPO | Attending: Emergency Medicine | Admitting: Emergency Medicine

## 2022-01-23 ENCOUNTER — Emergency Department: Payer: BC Managed Care – PPO

## 2022-01-23 ENCOUNTER — Encounter: Payer: Self-pay | Admitting: Emergency Medicine

## 2022-01-23 ENCOUNTER — Other Ambulatory Visit: Payer: Self-pay

## 2022-01-23 DIAGNOSIS — I10 Essential (primary) hypertension: Secondary | ICD-10-CM | POA: Insufficient documentation

## 2022-01-23 DIAGNOSIS — R000 Tachycardia, unspecified: Secondary | ICD-10-CM | POA: Diagnosis not present

## 2022-01-23 DIAGNOSIS — E119 Type 2 diabetes mellitus without complications: Secondary | ICD-10-CM | POA: Insufficient documentation

## 2022-01-23 DIAGNOSIS — B349 Viral infection, unspecified: Secondary | ICD-10-CM

## 2022-01-23 DIAGNOSIS — R509 Fever, unspecified: Secondary | ICD-10-CM | POA: Diagnosis present

## 2022-01-23 DIAGNOSIS — J449 Chronic obstructive pulmonary disease, unspecified: Secondary | ICD-10-CM | POA: Diagnosis not present

## 2022-01-23 LAB — CBC WITH DIFFERENTIAL/PLATELET
Abs Immature Granulocytes: 0.06 10*3/uL (ref 0.00–0.07)
Basophils Absolute: 0 10*3/uL (ref 0.0–0.1)
Basophils Relative: 0 %
Eosinophils Absolute: 0 10*3/uL (ref 0.0–0.5)
Eosinophils Relative: 0 %
HCT: 43.2 % (ref 39.0–52.0)
Hemoglobin: 14.3 g/dL (ref 13.0–17.0)
Immature Granulocytes: 1 %
Lymphocytes Relative: 17 %
Lymphs Abs: 2.2 10*3/uL (ref 0.7–4.0)
MCH: 28.9 pg (ref 26.0–34.0)
MCHC: 33.1 g/dL (ref 30.0–36.0)
MCV: 87.3 fL (ref 80.0–100.0)
Monocytes Absolute: 2.1 10*3/uL — ABNORMAL HIGH (ref 0.1–1.0)
Monocytes Relative: 17 %
Neutro Abs: 8.3 10*3/uL — ABNORMAL HIGH (ref 1.7–7.7)
Neutrophils Relative %: 65 %
Platelets: 250 10*3/uL (ref 150–400)
RBC: 4.95 MIL/uL (ref 4.22–5.81)
RDW: 13.1 % (ref 11.5–15.5)
WBC: 12.7 10*3/uL — ABNORMAL HIGH (ref 4.0–10.5)
nRBC: 0 % (ref 0.0–0.2)

## 2022-01-23 LAB — URINALYSIS, ROUTINE W REFLEX MICROSCOPIC
Bacteria, UA: NONE SEEN
Bilirubin Urine: NEGATIVE
Glucose, UA: >=500 mg/dL — AB
Ketones, ur: NEGATIVE mg/dL
Leukocytes,Ua: NEGATIVE
Nitrite: NEGATIVE
Protein, ur: NEGATIVE mg/dL
Specific Gravity, Urine: 1.026 (ref 1.005–1.030)
pH: 5 (ref 5.0–8.0)

## 2022-01-23 LAB — COMPREHENSIVE METABOLIC PANEL
ALT: 19 U/L (ref 0–44)
AST: 21 U/L (ref 15–41)
Albumin: 4.4 g/dL (ref 3.5–5.0)
Alkaline Phosphatase: 48 U/L (ref 38–126)
Anion gap: 7 (ref 5–15)
BUN: 14 mg/dL (ref 8–23)
CO2: 26 mmol/L (ref 22–32)
Calcium: 9.2 mg/dL (ref 8.9–10.3)
Chloride: 102 mmol/L (ref 98–111)
Creatinine, Ser: 1.14 mg/dL (ref 0.61–1.24)
GFR, Estimated: >60 mL/min (ref >60)
Glucose, Bld: 126 mg/dL — ABNORMAL HIGH (ref 70–99)
Potassium: 3.5 mmol/L (ref 3.5–5.1)
Sodium: 135 mmol/L (ref 135–145)
Total Bilirubin: 0.8 mg/dL (ref 0.3–1.2)
Total Protein: 7.6 g/dL (ref 6.5–8.1)

## 2022-01-23 LAB — TROPONIN I (HIGH SENSITIVITY): Troponin I (High Sensitivity): 9 ng/L (ref <18)

## 2022-01-23 LAB — LACTIC ACID, PLASMA: Lactic Acid, Venous: 1.5 mmol/L (ref 0.5–1.9)

## 2022-01-23 MED ORDER — NAPROXEN 500 MG PO TABS: 500.0000 mg | ORAL_TABLET | Freq: Two times a day (BID) | ORAL | 0 refills | Status: DC

## 2022-01-23 MED ORDER — NAPROXEN 500 MG PO TABS
500.0000 mg | ORAL_TABLET | Freq: Once | ORAL | Status: AC
  Administered 2022-01-23: 500 mg via ORAL
  Filled 2022-01-23: qty 1

## 2022-01-23 MED ORDER — ONDANSETRON 4 MG PO TBDP: 4.0000 mg | ORAL_TABLET | Freq: Three times a day (TID) | ORAL | 0 refills | Status: DC | PRN

## 2022-01-23 NOTE — ED Triage Notes (Addendum)
Pt arrived via POV with reports of fever since Saturday morning, pt also c/o R flank pain, pt states fever at home with highest temp of 102.  Pt taking tylenol with last dose last night.  Reports slight cough.   Pt also c/o headache and dizziness.

## 2022-01-23 NOTE — ED Provider Notes (Signed)
[facility] Provider Note    Event Date/Time   First MD Initiated Contact with Patient 01/23/22 [phone]     (approximate)   History   Chief Complaint: Fever, Headache, and Dizziness   HPI  Joseph Daugherty is a 61 y.o. male with a history of hypertension, diabetes, pancreatitis, COPD who comes the ED complaining of right flank pain that is Gradual onset, dull, moderate intensity, nonradiating, no aggravating or alleviating factors, constant for the past 2 days.  Associated with a fever at home of 102.  Also complains of a mild productive cough, denies chest pain or shortness of breath.  Also has bilateral frontal headache.  No vomiting, no diarrhea.     Physical Exam   Triage Vital Signs: ED Triage Vitals  Enc Vitals Group     BP 01/23/22 0641 (!) 141/81     Pulse Rate 01/23/22 0641 (!) 105     Resp 01/23/22 0641 18     Temp 01/23/22 0641 98.9 F (37.2 C)     Temp Source 01/23/22 0641 Oral     SpO2 01/23/22 0641 97 %     Weight 01/23/22 0639 243 lb (110.2 kg)     Height 01/23/22 0639 '6\' 4"'$  (1.93 m)     Head Circumference --      Peak Flow --      Pain Score 01/23/22 0638 8     Pain Loc --      Pain Edu? --      Excl. in [place]? --     Most recent vital signs: Vitals:   01/23/22 0641  BP: (!) 141/81  Pulse: (!) 105  Resp: 18  Temp: 98.9 F (37.2 C)  SpO2: 97%    General: Awake, no distress.  Nontoxic, moist mucosa CV:  Good peripheral perfusion.  Regular rate rhythm, heart rate 90 Resp:  Normal effort.  Clear to auscultation bilaterally without crackles or wheezes. Abd:  No distention.  Soft with mild right lower quadrant tenderness Other:  No lower extremity edema or calf tenderness.  No cervical lymphadenopathy.  Thyroid nonpalpable.  Neck is supple, full range of motion, no spinal tenderness   ED Results / Procedures / Treatments   Labs (all labs ordered are listed, but only abnormal results are displayed) Labs Reviewed  COMPREHENSIVE  METABOLIC PANEL - Abnormal; Notable for the following components:      Result Value   Glucose, Bld 126 (*)    All other components within normal limits  CBC WITH DIFFERENTIAL/PLATELET - Abnormal; Notable for the following components:   WBC 12.7 (*)    Neutro Abs 8.3 (*)    Monocytes Absolute 2.1 (*)    All other components within normal limits  URINALYSIS, ROUTINE W REFLEX MICROSCOPIC - Abnormal; Notable for the following components:   Color, Urine YELLOW (*)    APPearance CLEAR (*)    Glucose, UA >=500 (*)    Hgb urine dipstick MODERATE (*)    All other components within normal limits  LACTIC ACID, PLASMA  TROPONIN I (HIGH SENSITIVITY)     EKG Interpreted by me Sinus tachycardia rate 104.  Normal axis, normal intervals.  Poor R wave progression.  Normal ST segments and T waves.   RADIOLOGY Chest x-ray interpreted by me, appears normal without consolidation or effusion.  Radiology report reviewed  CT abdomen pelvis unremarkable.  Normal appendix  PROCEDURES:  Procedures   MEDICATIONS ORDERED IN ED: Medications  naproxen (NAPROSYN) tablet 500 mg (  500 mg Oral Given 01/23/22 0817)     IMPRESSION / MDM / ASSESSMENT AND PLAN / ED COURSE  I reviewed the triage vital signs and the nursing notes.                              Differential diagnosis includes, but is not limited to, viral syndrome, appendicitis, kidney stone, cystitis, pneumonia  Patient's presentation is most consistent with acute presentation with potential threat to life or bodily function.  Patient presents with flank pain and fever as well as multiple constitutional symptoms.  CBC, CMP, lactate, UA all essentially normal.  We will need to obtain a CT of the abdomen to evaluate for ureterolithiasis versus appendicitis.  We will give naproxen for pain and inflammation relief.   ----------------------------------------- 9:52 AM on 01/23/2022 ----------------------------------------- CT negative.  Patient  is tolerating oral intake, not requiring admission due to reassuring work-up.      FINAL CLINICAL IMPRESSION(S) / ED DIAGNOSES   Final diagnoses:  Fever, unspecified fever cause  Viral syndrome     Rx / DC Orders   ED Discharge Orders          Ordered    naproxen (NAPROSYN) 500 MG tablet  2 times daily with meals        01/23/22 0951    ondansetron (ZOFRAN-ODT) 4 MG disintegrating tablet  Every 8 hours PRN        01/23/22 0951             Note:  This document was prepared using Dragon voice recognition software and may include unintentional dictation errors.   Carrie Mew, MD 01/23/22 [phone]

## 2022-01-23 NOTE — ED Notes (Signed)
See triage note  Presents with subjective fever at home and headache since Friday  States temp at home was 102 over the weekend   afebrile on arrival   Slight cough

## 2022-01-23 NOTE — Discharge Instructions (Addendum)
Your lab tests, chest x-ray, and CT scan of the abdomen were all okay today.  Continue taking anti-inflammatory medicine for pain and fever.  Drink lots of water to stay well-hydrated and follow-up with your primary care doctor later this week.

## 2022-02-17 ENCOUNTER — Other Ambulatory Visit: Payer: BC Managed Care – PPO | Admitting: Urology

## 2022-02-24 ENCOUNTER — Ambulatory Visit: Payer: BC Managed Care – PPO | Admitting: Urology

## 2022-03-03 ENCOUNTER — Encounter: Payer: Self-pay | Admitting: Urology

## 2022-03-03 ENCOUNTER — Ambulatory Visit (INDEPENDENT_AMBULATORY_CARE_PROVIDER_SITE_OTHER): Payer: BC Managed Care – PPO | Admitting: Urology

## 2022-03-03 VITALS — BP 121/84 | HR 86 | Ht 76.0 in | Wt 229.0 lb

## 2022-03-03 DIAGNOSIS — C61 Malignant neoplasm of prostate: Secondary | ICD-10-CM

## 2022-03-03 MED ORDER — LEVOFLOXACIN 500 MG PO TABS
500.0000 mg | ORAL_TABLET | Freq: Once | ORAL | Status: AC
  Administered 2022-03-03: 500 mg via ORAL

## 2022-03-03 MED ORDER — GENTAMICIN SULFATE 40 MG/ML IJ SOLN
80.0000 mg | Freq: Once | INTRAMUSCULAR | Status: AC
  Administered 2022-03-03: 80 mg via INTRAMUSCULAR

## 2022-03-03 NOTE — Progress Notes (Signed)
Prostate Biopsy Procedure   Informed consent was obtained after discussing risks/benefits of the procedure.  A time out was performed to ensure correct patient identity.  Pre-Procedure: - T1c low risk prostate cancer for confirmatory biopsy - MRI 01/05/2022 45 cc gland.  No suspicious lesions - Gentamicin given prophylactically - Levaquin 500 mg administered PO -Transrectal Ultrasound performed revealing a 56 gm prostate -No significant hypoechoic or median lobe noted  Procedure: - Prostate block performed using 10 cc 1% lidocaine and biopsies taken from sextant areas, a total of 12 under ultrasound guidance.  Post-Procedure: - Patient tolerated the procedure well - He was counseled to seek immediate medical attention if experiences any severe pain, significant bleeding, or fevers -Will call with biopsy results   John Giovanni, MD

## 2022-03-07 LAB — SURGICAL PATHOLOGY

## 2022-03-09 ENCOUNTER — Encounter: Payer: Self-pay | Admitting: Urology

## 2022-03-13 ENCOUNTER — Encounter: Payer: Self-pay | Admitting: Urology

## 2022-03-13 ENCOUNTER — Ambulatory Visit (INDEPENDENT_AMBULATORY_CARE_PROVIDER_SITE_OTHER): Payer: BC Managed Care – PPO | Admitting: Urology

## 2022-03-13 VITALS — BP 169/91 | HR 89 | Ht 76.0 in | Wt 234.0 lb

## 2022-03-13 DIAGNOSIS — N401 Enlarged prostate with lower urinary tract symptoms: Secondary | ICD-10-CM | POA: Diagnosis not present

## 2022-03-13 DIAGNOSIS — C61 Malignant neoplasm of prostate: Secondary | ICD-10-CM | POA: Diagnosis not present

## 2022-03-13 MED ORDER — FINASTERIDE 5 MG PO TABS: 5.0000 mg | ORAL_TABLET | Freq: Every day | ORAL | 3 refills | Status: DC

## 2022-03-13 NOTE — Progress Notes (Signed)
03/13/2022 3:08 PM   Joseph Daugherty Jan 23, 1961 [id number]  Referring provider: [name], MD [place],  [place] [address]  Chief Complaint  Patient presents with   Other    Urologic history:   1.  T1c low risk prostate cancer -Dx 01/2020; PSA 6.7; 72 g prostate -2 cores Gleason 3+3 < 10%   2.  BPH with LUTS -On tamsulosin/finasteride -Finasteride started 02/2020   3.  Erectile dysfunction  HPI: 61 y.o. male presents for prostate biopsy results  Confirmatory biopsy performed 03/03/2022.  MRI prior to biopsy showed a 45 cc gland and no high-grade lesions TRUS volume 56 g; 12 cores biopsy performed No postbiopsy complaints Path: 2/12 cores showed Gleason 3+3 adenocarcinoma (16%, 16%) He was sent a MyChart message regarding the results however states he did not quite understand and wanted to come in and discuss  PMH: Past Medical History:  Diagnosis Date   Diabetes mellitus without complication ([place])    Hyperlipidemia    Hypertension     Surgical History: Past Surgical History:  Procedure Laterality Date   PROSTATE BIOPSY  01/30/2020    Home Medications:  Allergies as of 03/13/2022       Reactions   Penicillins Hives, Anaphylaxis        Medication List        Accurate as of March 13, 2022  3:08 PM. If you have any questions, ask your nurse or doctor.          amLODipine 10 MG tablet Commonly known as: NORVASC Take 1 tablet by mouth daily.   atenolol 50 MG tablet Commonly known as: TENORMIN Take 50 mg by mouth daily.   celecoxib 200 MG capsule Commonly known as: CELEBREX Take 200 mg by mouth daily.   cyclobenzaprine 5 MG tablet Commonly known as: FLEXERIL Take by mouth.   dapagliflozin propanediol 10 MG Tabs tablet Commonly known as: FARXIGA Take 10 mg by mouth daily.   finasteride 5 MG tablet Commonly known as: Proscar Take 1 tablet (5 mg total) by mouth daily.   Fluticasone-Salmeterol 100-50 MCG/DOSE  Aepb Commonly known as: ADVAIR Inhale into the lungs.   glipiZIDE 5 MG 24 hr tablet Commonly known as: GLUCOTROL XL Take 5 mg by mouth daily.   ibuprofen 600 MG tablet Commonly known as: ADVIL Take 1 tablet (600 mg total) by mouth every 6 (six) hours as needed.   lisinopril 40 MG tablet Commonly known as: ZESTRIL Take 40 mg by mouth daily.   lisinopril-hydrochlorothiazide 20-12.5 MG tablet Commonly known as: ZESTORETIC Take 1 tablet by mouth daily.   meloxicam 15 MG tablet Commonly known as: MOBIC Take 15 mg by mouth daily.   metFORMIN 1000 MG tablet Commonly known as: GLUCOPHAGE Take 1,000 mg by mouth 2 (two) times daily.   metoprolol succinate 25 MG 24 hr tablet Commonly known as: TOPROL-XL Take 25 mg by mouth daily.   naproxen 500 MG tablet Commonly known as: Naprosyn Take 1 tablet (500 mg total) by mouth 2 (two) times daily with a meal.   ondansetron 4 MG disintegrating tablet Commonly known as: ZOFRAN-ODT Take 1 tablet (4 mg total) by mouth every 8 (eight) hours as needed for nausea or vomiting.   rosuvastatin 10 MG tablet Commonly known as: CRESTOR Take 10 mg by mouth daily.   sildenafil 100 MG tablet Commonly known as: VIAGRA TAKE (1) TABLET BY MOUTH 1 HOUR PRIOR TO SEXUAL ACTIVITY. ~MAX OF 1 TAB PER DAY~   sitaGLIPtin-metformin 50-1000  MG tablet Commonly known as: JANUMET Take 1 tablet by mouth 2 (two) times daily with a meal.   tamsulosin 0.4 MG Caps capsule Commonly known as: FLOMAX Take 1 capsule (0.4 mg total) by mouth daily.        Allergies:  Allergies  Allergen Reactions   Penicillins Hives and Anaphylaxis    Family History: No family history on file.  Social History:  reports that he has been smoking cigarettes. He has been smoking an average of .25 packs per day. He has never used smokeless tobacco. He reports that he does not currently use drugs. He reports that he does not drink alcohol.   Physical Exam: BP (!) 169/91    Pulse 89   Ht '6\' 4"'$  (1.93 m)   Wt 234 lb (106.1 kg)   BMI 28.48 kg/m   Constitutional:  Alert and oriented, No acute distress. HEENT: [place] AT, moist mucus membranes.  Trachea midline, no masses. Cardiovascular: No clubbing, cyanosis, or edema. Respiratory: Normal respiratory effort, no increased work of breathing. GU: Prostate 50 g, smooth without nodules Psychiatric: Normal mood and affect.   Assessment & Plan:    T1c prostate cancer-low risk Confirmed core biopsy with 2/12 cores Gleason 3+3 adenocarcinoma He did to continue active surveillance Follow-up 6 months with PSA  2.  BPH with LUTS Stable Needed a refill on finasteride   [name], MD  [place] [address] [place], [place] [address] [phone]

## 2022-08-17 ENCOUNTER — Emergency Department: Payer: BC Managed Care – PPO

## 2022-08-17 ENCOUNTER — Other Ambulatory Visit: Payer: Self-pay

## 2022-08-17 ENCOUNTER — Emergency Department
Admission: EM | Admit: 2022-08-17 | Discharge: 2022-08-17 | Disposition: A | Discharge status: Home / Self Care | Payer: BC Managed Care – PPO | Attending: Emergency Medicine | Admitting: Emergency Medicine

## 2022-08-17 DIAGNOSIS — R059 Cough, unspecified: Secondary | ICD-10-CM | POA: Diagnosis present

## 2022-08-17 DIAGNOSIS — I10 Essential (primary) hypertension: Secondary | ICD-10-CM | POA: Diagnosis not present

## 2022-08-17 DIAGNOSIS — Z1152 Encounter for screening for COVID-19: Secondary | ICD-10-CM | POA: Diagnosis not present

## 2022-08-17 DIAGNOSIS — Z87891 Personal history of nicotine dependence: Secondary | ICD-10-CM | POA: Diagnosis not present

## 2022-08-17 DIAGNOSIS — R0602 Shortness of breath: Secondary | ICD-10-CM

## 2022-08-17 DIAGNOSIS — R051 Acute cough: Secondary | ICD-10-CM

## 2022-08-17 DIAGNOSIS — E119 Type 2 diabetes mellitus without complications: Secondary | ICD-10-CM | POA: Diagnosis not present

## 2022-08-17 DIAGNOSIS — J40 Bronchitis, not specified as acute or chronic: Secondary | ICD-10-CM | POA: Diagnosis not present

## 2022-08-17 LAB — COMPREHENSIVE METABOLIC PANEL
ALT: 17 U/L (ref 0–44)
AST: 17 U/L (ref 15–41)
Albumin: 4.6 g/dL (ref 3.5–5.0)
Alkaline Phosphatase: 49 U/L (ref 38–126)
Anion gap: 6 (ref 5–15)
BUN: 12 mg/dL (ref 8–23)
CO2: 32 mmol/L (ref 22–32)
Calcium: 9.3 mg/dL (ref 8.9–10.3)
Chloride: 100 mmol/L (ref 98–111)
Creatinine, Ser: 1.1 mg/dL (ref 0.61–1.24)
GFR, Estimated: >60 mL/min (ref >60)
Glucose, Bld: 117 mg/dL — ABNORMAL HIGH (ref 70–99)
Potassium: 3.8 mmol/L (ref 3.5–5.1)
Sodium: 138 mmol/L (ref 135–145)
Total Bilirubin: 0.6 mg/dL (ref 0.3–1.2)
Total Protein: 8 g/dL (ref 6.5–8.1)

## 2022-08-17 LAB — CBC WITH DIFFERENTIAL/PLATELET
Abs Immature Granulocytes: 0.01 10*3/uL (ref 0.00–0.07)
Basophils Absolute: 0 10*3/uL (ref 0.0–0.1)
Basophils Relative: 1 %
Eosinophils Absolute: 0.2 10*3/uL (ref 0.0–0.5)
Eosinophils Relative: 3 %
HCT: 47 % (ref 39.0–52.0)
Hemoglobin: 14.9 g/dL (ref 13.0–17.0)
Immature Granulocytes: 0 %
Lymphocytes Relative: 52 %
Lymphs Abs: 3.3 10*3/uL (ref 0.7–4.0)
MCH: 28.3 pg (ref 26.0–34.0)
MCHC: 31.7 g/dL (ref 30.0–36.0)
MCV: 89.2 fL (ref 80.0–100.0)
Monocytes Absolute: 1 10*3/uL (ref 0.1–1.0)
Monocytes Relative: 17 %
Neutro Abs: 1.7 10*3/uL (ref 1.7–7.7)
Neutrophils Relative %: 27 %
Platelets: 298 10*3/uL (ref 150–400)
RBC: 5.27 MIL/uL (ref 4.22–5.81)
RDW: 13.6 % (ref 11.5–15.5)
WBC: 6.3 10*3/uL (ref 4.0–10.5)
nRBC: 0 % (ref 0.0–0.2)

## 2022-08-17 LAB — RESP PANEL BY RT-PCR (RSV, FLU A&B, COVID)  RVPGX2
Influenza A by PCR: NEGATIVE
Influenza B by PCR: NEGATIVE
Resp Syncytial Virus by PCR: NEGATIVE
SARS Coronavirus 2 by RT PCR: NEGATIVE

## 2022-08-17 MED ORDER — DOXYCYCLINE MONOHYDRATE 100 MG PO TABS: 100.0000 mg | ORAL_TABLET | Freq: Two times a day (BID) | ORAL | 0 refills | Status: AC

## 2022-08-17 MED ORDER — BENZONATATE 100 MG PO CAPS: 100.0000 mg | ORAL_CAPSULE | Freq: Three times a day (TID) | ORAL | 0 refills | Status: AC | PRN

## 2022-08-17 MED ORDER — PREDNISONE 50 MG PO TABS
50.0000 mg | ORAL_TABLET | Freq: Every day | ORAL | 0 refills | Status: AC
Start: 2022-08-17 — End: 2022-08-22

## 2022-08-17 NOTE — ED Provider Notes (Signed)
[facility] Provider Note    Event Date/Time   First MD Initiated Contact with Patient 08/17/22 1143     (approximate)   History   Cough and Shortness of Breath   HPI  Joseph Daugherty is a 62 y.o. male with past medical history significant for tobacco use, hypertension, diabetes who presents to the emergency department with cough and shortness of breath.  States that he had a cold at the beginning of the year that resolved and over the past couple of days started to feel worse again.  Endorses a productive cough with shortness of breath and feels like he had some wheezing.  Does endorse tobacco use but has been cutting back since the beginning of the year.  No chest pain.  No history of DVT or PE.  No recent travel.  No recent antibiotic use.  Follows with a primary care provider.     Physical Exam   Triage Vital Signs: ED Triage Vitals  Enc Vitals Group     BP 08/17/22 0840 134/84     Pulse Rate 08/17/22 0840 86     Resp 08/17/22 0840 17     Temp 08/17/22 0840 97.7 F (36.5 C)     Temp src --      SpO2 08/17/22 0840 98 %     Weight 08/17/22 0849 237 lb (107.5 kg)     Height 08/17/22 0849 '6\' 4"'$  (1.93 m)     Head Circumference --      Peak Flow --      Pain Score 08/17/22 0849 0     Pain Loc --      Pain Edu? --      Excl. in [place]? --     Most recent vital signs: Vitals:   08/17/22 0840  BP: 134/84  Pulse: 86  Resp: 17  Temp: 97.7 F (36.5 C)  SpO2: 98%    Physical Exam Constitutional:      Appearance: He is well-developed.  HENT:     Head: Atraumatic.  Eyes:     Conjunctiva/sclera: Conjunctivae normal.  Cardiovascular:     Rate and Rhythm: Regular rhythm.  Pulmonary:     Effort: No respiratory distress.     Breath sounds: Wheezing (Diffuse) and rhonchi present.  Musculoskeletal:     Cervical back: Normal range of motion.  Skin:    General: Skin is warm.  Neurological:     Mental Status: He is alert. Mental status is at  baseline.     IMPRESSION / MDM / ASSESSMENT AND PLAN / ED COURSE  I reviewed the triage vital signs and the nursing notes.  Differential diagnosis including COPD exacerbation/diagnoses, bacterial pneumonia, viral illness including COVID/influenza, anemia  EKG  I, Nathaniel Man, the attending physician, personally viewed and interpreted this ECG.   Rate: Normal  Rhythm: Normal sinus  Axis: Normal  Intervals: Normal  ST&T Change: None  No tachycardic or bradycardic dysrhythmias while on cardiac telemetry.  RADIOLOGY I independently reviewed imaging, my interpretation of imaging: Chest x-ray with no focal findings consistent with pneumonia  LABS (all labs ordered are listed, but only abnormal results are displayed) Labs interpreted as -    Labs Reviewed  COMPREHENSIVE METABOLIC PANEL - Abnormal; Notable for the following components:      Result Value   Glucose, Bld 117 (*)    All other components within normal limits  RESP PANEL BY RT-PCR (RSV, FLU A&B, COVID)  [id number]  CBC WITH  DIFFERENTIAL/PLATELET    TREATMENT    Patient with significant wheezing on exam given his history of tobacco use and productive cough we will treat the patient for acute bronchitis with prednisone and doxycycline.  Patient has an appointment with primary care provider next week.  Given return precautions for worsening symptoms.  Patient has albuterol inhaler at home.  PROCEDURES:  Critical Care performed: No  Procedures  Patient's presentation is most consistent with acute presentation with potential threat to life or bodily function.   MEDICATIONS ORDERED IN ED: Medications - No data to display  FINAL CLINICAL IMPRESSION(S) / ED DIAGNOSES   Final diagnoses:  Bronchitis  Acute cough  Shortness of breath     Rx / DC Orders   ED Discharge Orders          Ordered    benzonatate (TESSALON PERLES) 100 MG capsule  3 times daily PRN        08/17/22 1155    doxycycline (ADOXA) 100 MG  tablet  2 times daily        08/17/22 1155    predniSONE (DELTASONE) 50 MG tablet  Daily with breakfast        08/17/22 1155             Note:  This document was prepared using Dragon voice recognition software and may include unintentional dictation errors.   Nathaniel Man, MD 08/17/22 1159

## 2022-08-17 NOTE — ED Notes (Signed)
Signature pad not available. Pt verbalizes understanding of d/c instructions. Denies questions or concerns.

## 2022-08-17 NOTE — ED Notes (Signed)
See triage note, pt reports thinks he has pneumonia, states at night gets fevers (max 101 relieved with tylenol) and shob. +cough. Ambulatory to treatment room, NAD noted. Steady gait. Able to speak in complete sentences.

## 2022-08-17 NOTE — ED Notes (Signed)
Green and purple top sent to the lab.

## 2022-08-17 NOTE — Discharge Instructions (Addendum)
You are seen in the emergency department for cough and shortness of breath.  You had wheezing and coarse breath sounds on exam.  You are negative for COVID and influenza.  You had no signs of a pneumonia on your chest x-ray.  You are started on steroids and an antibiotic.  Take these medications as prescribed.  The steroids will increase your glucose levels so watch very closely since you have a history of diabetes.  Call and follow-up closely with your primary care physician.  Return to the emergency department for worsening symptoms.  Albuterol -4 puffs every 4 hours as needed for shortness of breath and wheezing  Doxycycline - This medication can cause acid reflux.  It is important that you take it with food and drink plenty of water.  Do not lie down for 1 hour after taking this medication.  It also causes sun sensitivity so stay out of the sun or wear SPF while on this medication.

## 2022-08-17 NOTE — ED Triage Notes (Signed)
Pt c/o productive cough and some sob for the past 4 days. Pt concerned he has pneumonia. Pt AxOx4. NAD.

## 2022-09-13 ENCOUNTER — Other Ambulatory Visit: Payer: BC Managed Care – PPO

## 2022-09-13 DIAGNOSIS — C61 Malignant neoplasm of prostate: Secondary | ICD-10-CM

## 2022-09-14 LAB — PSA: Prostate Specific Ag, Serum: 19.7 ng/mL — ABNORMAL HIGH (ref 0.0–4.0)

## 2022-09-15 ENCOUNTER — Ambulatory Visit: Payer: BC Managed Care – PPO | Admitting: Urology

## 2022-09-18 ENCOUNTER — Telehealth: Payer: Self-pay

## 2022-09-18 ENCOUNTER — Telehealth: Payer: Self-pay | Admitting: Family Medicine

## 2022-09-18 ENCOUNTER — Other Ambulatory Visit: Payer: Self-pay | Admitting: Family Medicine

## 2022-09-18 ENCOUNTER — Other Ambulatory Visit: Payer: BC Managed Care – PPO

## 2022-09-18 DIAGNOSIS — R972 Elevated prostate specific antigen [PSA]: Secondary | ICD-10-CM

## 2022-09-18 LAB — URINALYSIS, COMPLETE
Bilirubin, UA: NEGATIVE
Ketones, UA: NEGATIVE
Leukocytes,UA: NEGATIVE
Nitrite, UA: NEGATIVE
Protein,UA: NEGATIVE
Specific Gravity, UA: 1.01 (ref 1.005–1.030)
Urobilinogen, Ur: 0.2 mg/dL (ref 0.2–1.0)
pH, UA: 5.5 (ref 5.0–7.5)

## 2022-09-18 LAB — MICROSCOPIC EXAMINATION: Bacteria, UA: NONE SEEN

## 2022-09-18 NOTE — Telephone Encounter (Signed)
Patient notified and voiced understanding, UA lab appointment scheduled.

## 2022-09-18 NOTE — Telephone Encounter (Signed)
Pt LM on triage line requesting results of urinalysis. Please advise.

## 2022-09-18 NOTE — Telephone Encounter (Signed)
-----   Message from [name], MD sent at 09/17/2022  1:43 PM EST ----- PSA was significantly higher at 19.7.  Please schedule lab visit for UA to make sure he has no evidence of infection.

## 2022-09-19 NOTE — Telephone Encounter (Signed)
Patient notified and voiced understanding, PSA lab appointment scheduled.

## 2022-09-19 NOTE — Telephone Encounter (Signed)
Left message to call back  

## 2022-09-19 NOTE — Telephone Encounter (Signed)
Urinalysis was normal.  His PSA performed at [facility] clinic in October was normal.  Large jumps in the PSA if not due to infection are typically due to inflammation and not cancer.  Recommend repeating his PSA 1 week prior to his appointment with me in 10/18/2022

## 2022-10-12 ENCOUNTER — Other Ambulatory Visit: Payer: Self-pay | Admitting: *Deleted

## 2022-10-12 DIAGNOSIS — R972 Elevated prostate specific antigen [PSA]: Secondary | ICD-10-CM

## 2022-10-13 ENCOUNTER — Other Ambulatory Visit: Payer: BC Managed Care – PPO

## 2022-10-13 DIAGNOSIS — R972 Elevated prostate specific antigen [PSA]: Secondary | ICD-10-CM

## 2022-10-14 LAB — PSA: Prostate Specific Ag, Serum: 1.4 ng/mL (ref 0.0–4.0)

## 2022-10-18 ENCOUNTER — Ambulatory Visit (INDEPENDENT_AMBULATORY_CARE_PROVIDER_SITE_OTHER): Payer: BC Managed Care – PPO | Admitting: Urology

## 2022-10-18 ENCOUNTER — Encounter: Payer: Self-pay | Admitting: Urology

## 2022-10-18 VITALS — BP 133/84 | HR 91 | Ht 76.0 in | Wt 232.0 lb

## 2022-10-18 DIAGNOSIS — N401 Enlarged prostate with lower urinary tract symptoms: Secondary | ICD-10-CM | POA: Diagnosis not present

## 2022-10-18 DIAGNOSIS — C61 Malignant neoplasm of prostate: Secondary | ICD-10-CM | POA: Diagnosis not present

## 2022-10-18 MED ORDER — TAMSULOSIN HCL 0.4 MG PO CAPS: 0.4000 mg | ORAL_CAPSULE | Freq: Every day | ORAL | 3 refills | Status: DC

## 2022-10-18 MED ORDER — FINASTERIDE 5 MG PO TABS: 5.0000 mg | ORAL_TABLET | Freq: Every day | ORAL | 3 refills | Status: DC

## 2022-10-18 NOTE — Progress Notes (Signed)
I, [name],acting as a scribe for [name], MD.,have documented all relevant documentation on the behalf of [name], MD,as directed by  [name], MD while in the presence of [name], MD.   10/18/22 9:26 AM   Joseph Daugherty 1961-04-07 [id number]  Referring provider: [name], MD [place],  [place] [address]  Chief Complaint  Patient presents with   Prostate Cancer    Urologic history:   1.  T1c low risk prostate cancer -Dx 01/2020; PSA 6.7; 72 g prostate -2 cores Gleason 3+3 < 10% -Confirmatory biopsy performed 03/03/2022.  MRI prior to biopsy showed a 45 cc gland and no high-grade lesions -TRUS volume 56 g; 12 core biopsy performed -Path: 2/12 cores showed Gleason 3+3 adenocarcinoma (16%, 16%)   2.  BPH with LUTS -On tamsulosin/finasteride -Finasteride started 02/2020   3.  Erectile dysfunction  HPI: 62 y.o. male who presents for 6 month follow-up.  Doing well since last visit No bothersome LUTS Remains on Tamsulosin/finasteride Primary complaint is the loss of seminal emission, although he reports normal sensation and no pain during ejaculation. Denies dysuria, gross hematuria Denies flank, abdominal or pelvic pain  PSA 09/13/22 was significantly above baseline at 19.7. He came in for urinalysis which was normal. PSA was repeated 10-13-22 and it was 1.4.  PMH: Past Medical History:  Diagnosis Date   Diabetes mellitus without complication ([place])    Hyperlipidemia    Hypertension     Surgical History: Past Surgical History:  Procedure Laterality Date   PROSTATE BIOPSY  01/30/2020    Home Medications:  Allergies as of 10/18/2022       Reactions   Penicillins Hives, Anaphylaxis        Medication List        Accurate as of October 18, 2022  9:26 AM. If you have any questions, ask your nurse or doctor.          amLODipine 10 MG tablet Commonly known as: NORVASC Take 1 tablet by mouth  daily.   atenolol 50 MG tablet Commonly known as: TENORMIN Take 50 mg by mouth daily.   benzonatate 100 MG capsule Commonly known as: Tessalon Perles Take 1 capsule (100 mg total) by mouth 3 (three) times daily as needed for cough.   celecoxib 200 MG capsule Commonly known as: CELEBREX Take 200 mg by mouth daily.   cyclobenzaprine 5 MG tablet Commonly known as: FLEXERIL Take by mouth.   dapagliflozin propanediol 10 MG Tabs tablet Commonly known as: FARXIGA Take 10 mg by mouth daily.   finasteride 5 MG tablet Commonly known as: Proscar Take 1 tablet (5 mg total) by mouth daily.   Fluticasone-Salmeterol 100-50 MCG/DOSE Aepb Commonly known as: ADVAIR Inhale into the lungs.   glipiZIDE 5 MG 24 hr tablet Commonly known as: GLUCOTROL XL Take 5 mg by mouth daily.   ibuprofen 600 MG tablet Commonly known as: ADVIL Take 1 tablet (600 mg total) by mouth every 6 (six) hours as needed.   lisinopril 40 MG tablet Commonly known as: ZESTRIL Take 40 mg by mouth daily.   lisinopril-hydrochlorothiazide 20-12.5 MG tablet Commonly known as: ZESTORETIC Take 1 tablet by mouth daily.   meloxicam 15 MG tablet Commonly known as: MOBIC Take 15 mg by mouth daily.   metFORMIN 1000 MG tablet Commonly known as: GLUCOPHAGE Take 1,000 mg by mouth 2 (two) times daily.   metoprolol succinate 25 MG 24 hr tablet Commonly known  as: TOPROL-XL Take 25 mg by mouth daily.   naproxen 500 MG tablet Commonly known as: Naprosyn Take 1 tablet (500 mg total) by mouth 2 (two) times daily with a meal.   ondansetron 4 MG disintegrating tablet Commonly known as: ZOFRAN-ODT Take 1 tablet (4 mg total) by mouth every 8 (eight) hours as needed for nausea or vomiting.   rosuvastatin 10 MG tablet Commonly known as: CRESTOR Take 10 mg by mouth daily.   sildenafil 100 MG tablet Commonly known as: VIAGRA TAKE (1) TABLET BY MOUTH 1 HOUR PRIOR TO SEXUAL ACTIVITY. ~MAX OF 1 TAB PER DAY~    sitaGLIPtin-metformin 50-1000 MG tablet Commonly known as: JANUMET Take 1 tablet by mouth 2 (two) times daily with a meal.   tamsulosin 0.4 MG Caps capsule Commonly known as: FLOMAX Take 1 capsule (0.4 mg total) by mouth daily.        Allergies:  Allergies  Allergen Reactions   Penicillins Hives and Anaphylaxis    Family History: History reviewed. No pertinent family history.  Social History:  reports that he has been smoking cigarettes. He has been smoking an average of .25 packs per day. He has never used smokeless tobacco. He reports that he does not currently use drugs. He reports that he does not drink alcohol.   Physical Exam: BP 133/84   Pulse 91   Ht 6\' 4"  (1.93 m)   Wt 232 lb (105.2 kg)   BMI 28.24 kg/m   Constitutional:  Alert and oriented, No acute distress. HEENT: [place] AT Respiratory: Normal respiratory effort, no increased work of breathing. Psychiatric: Normal mood and affect.  Assessment & Plan:    T1c prostate cancer-low risk PSA stable at 1.4 Follow-up 6 months with PSA/DRE  2.  BPH with LUTS Stable on Finasteride/Tamsulosin We discussed loss of seminal emission is a side effect of Tamsulosin. He was offered an alternative alpha blocker but did not desire to change medication.  I have reviewed the above documentation for accuracy and completeness, and I agree with the above.   [name], [place] [address], [place],  [address] [phone]

## 2023-04-19 ENCOUNTER — Other Ambulatory Visit: Payer: Self-pay | Admitting: *Deleted

## 2023-04-19 DIAGNOSIS — R972 Elevated prostate specific antigen [PSA]: Secondary | ICD-10-CM

## 2023-04-19 DIAGNOSIS — C61 Malignant neoplasm of prostate: Secondary | ICD-10-CM

## 2023-04-20 ENCOUNTER — Other Ambulatory Visit: Payer: BC Managed Care – PPO

## 2023-04-20 ENCOUNTER — Ambulatory Visit: Payer: BC Managed Care – PPO | Admitting: Urology

## 2023-04-20 DIAGNOSIS — R972 Elevated prostate specific antigen [PSA]: Secondary | ICD-10-CM

## 2023-04-20 DIAGNOSIS — C61 Malignant neoplasm of prostate: Secondary | ICD-10-CM

## 2023-04-21 LAB — PSA: Prostate Specific Ag, Serum: 1.2 ng/mL (ref 0.0–4.0)

## 2023-04-23 ENCOUNTER — Ambulatory Visit (INDEPENDENT_AMBULATORY_CARE_PROVIDER_SITE_OTHER): Payer: BC Managed Care – PPO | Admitting: Urology

## 2023-04-23 ENCOUNTER — Encounter: Payer: Self-pay | Admitting: Urology

## 2023-04-23 VITALS — BP 120/82 | HR 85 | Ht 76.0 in | Wt 232.0 lb

## 2023-04-23 DIAGNOSIS — N401 Enlarged prostate with lower urinary tract symptoms: Secondary | ICD-10-CM

## 2023-04-23 DIAGNOSIS — N476 Balanoposthitis: Secondary | ICD-10-CM | POA: Diagnosis not present

## 2023-04-23 DIAGNOSIS — C61 Malignant neoplasm of prostate: Secondary | ICD-10-CM | POA: Diagnosis not present

## 2023-04-23 MED ORDER — TAMSULOSIN HCL 0.4 MG PO CAPS: 0.4000 mg | ORAL_CAPSULE | Freq: Every day | ORAL | 3 refills | Status: DC

## 2023-04-23 MED ORDER — NYSTATIN-TRIAMCINOLONE 100000-0.1 UNIT/GM-% EX CREA
1.0000 | TOPICAL_CREAM | Freq: Two times a day (BID) | CUTANEOUS | 0 refills | Status: AC
Start: 2023-04-23 — End: 2023-04-30

## 2023-04-23 MED ORDER — FINASTERIDE 5 MG PO TABS: 5.0000 mg | ORAL_TABLET | Freq: Every day | ORAL | 3 refills | Status: DC

## 2023-04-23 NOTE — Progress Notes (Signed)
I, Maysun Anabel Bene, acting as a scribe for Riki Altes, MD., have documented all relevant documentation on the behalf of Riki Altes, MD, as directed by Riki Altes, MD while in the presence of Riki Altes, MD.  04/23/2023 9:38 AM   Joseph Daugherty Jun 15, 1961 [id number]  Referring provider: Enid Baas, MD [address] [place],  [place] [address]  Chief Complaint  Patient presents with   Prostate Cancer   Urologic history: 1.  T1c low risk prostate cancer Dx 01/2020; PSA 6.7; 72 g prostate 2 cores Gleason 3+3 < 10% Confirmatory biopsy performed 03/03/2022.  MRI prior to biopsy showed a 45 cc gland and no high-grade lesions TRUS volume 56 g; 12 core biopsy performed Path: 2/12 cores showed Gleason 3+3 adenocarcinoma (16%, 16%)   2.  BPH with LUTS On tamsulosin/finasteride Finasteride started 02/2020   3.  Erectile dysfunction  HPI: Joseph Daugherty is a 62 y.o. male presents for 6 month follow-up.  Doing well since last visit No bothersome LUTS Remains on tamsulosin/finasteride Denies dysuria, gross hematuria Denies flank, abdominal or pelvic pain  PSA 04/20/2023 stable 1.2 Complaining of itching of distal penis for the last week.  PSA trend   Prostate Specific Ag, Serum  Latest Ref Rng 0.0 - 4.0 ng/mL  01/09/2020 6.0 (H)   05/27/2020 2.6   12/01/2020 1.5   06/13/2021 1.9   12/13/2021 1.6   09/13/2022 19.7 (H)   10/13/2022 1.4   04/20/2023 1.2     PMH: Past Medical History:  Diagnosis Date   Diabetes mellitus without complication (HCC)    Hyperlipidemia    Hypertension     Surgical History: Past Surgical History:  Procedure Laterality Date   PROSTATE BIOPSY  01/30/2020    Home Medications:  Allergies as of 04/23/2023       Reactions   Penicillins Hives, Anaphylaxis        Medication List        Accurate as of April 23, 2023  9:38 AM. If you have any questions, ask your nurse or doctor.          amLODipine 10 MG  tablet Commonly known as: NORVASC Take 1 tablet by mouth daily.   atenolol 50 MG tablet Commonly known as: TENORMIN Take 50 mg by mouth daily.   benzonatate 100 MG capsule Commonly known as: Tessalon Perles Take 1 capsule (100 mg total) by mouth 3 (three) times daily as needed for cough.   celecoxib 200 MG capsule Commonly known as: CELEBREX Take 200 mg by mouth daily.   cyclobenzaprine 5 MG tablet Commonly known as: FLEXERIL Take by mouth.   dapagliflozin propanediol 10 MG Tabs tablet Commonly known as: FARXIGA Take 10 mg by mouth daily.   finasteride 5 MG tablet Commonly known as: Proscar Take 1 tablet (5 mg total) by mouth daily.   Fluticasone-Salmeterol 100-50 MCG/DOSE Aepb Commonly known as: ADVAIR Inhale into the lungs.   glipiZIDE 5 MG 24 hr tablet Commonly known as: GLUCOTROL XL Take 5 mg by mouth daily.   ibuprofen 600 MG tablet Commonly known as: ADVIL Take 1 tablet (600 mg total) by mouth every 6 (six) hours as needed.   lisinopril 40 MG tablet Commonly known as: ZESTRIL Take 40 mg by mouth daily.   lisinopril-hydrochlorothiazide 20-12.5 MG tablet Commonly known as: ZESTORETIC Take 1 tablet by mouth daily.   meloxicam 15 MG tablet Commonly known as: MOBIC Take 15 mg by mouth daily.   metFORMIN  1000 MG tablet Commonly known as: GLUCOPHAGE Take 1,000 mg by mouth 2 (two) times daily.   metoprolol succinate 25 MG 24 hr tablet Commonly known as: TOPROL-XL Take 25 mg by mouth daily.   naproxen 500 MG tablet Commonly known as: Naprosyn Take 1 tablet (500 mg total) by mouth 2 (two) times daily with a meal.   nystatin-triamcinolone cream Commonly known as: MYCOLOG II Apply 1 Application topically 2 (two) times daily for 7 days. Started by: Riki Altes   ondansetron 4 MG disintegrating tablet Commonly known as: ZOFRAN-ODT Take 1 tablet (4 mg total) by mouth every 8 (eight) hours as needed for nausea or vomiting.   rosuvastatin 10 MG  tablet Commonly known as: CRESTOR Take 10 mg by mouth daily.   sildenafil 100 MG tablet Commonly known as: VIAGRA TAKE (1) TABLET BY MOUTH 1 HOUR PRIOR TO SEXUAL ACTIVITY. ~MAX OF 1 TAB PER DAY~   sitaGLIPtin-metformin 50-1000 MG tablet Commonly known as: JANUMET Take 1 tablet by mouth 2 (two) times daily with a meal.   tamsulosin 0.4 MG Caps capsule Commonly known as: FLOMAX Take 1 capsule (0.4 mg total) by mouth daily.        Allergies:  Allergies  Allergen Reactions   Penicillins Hives and Anaphylaxis    Social History:  reports that he has been smoking cigarettes. He has never used smokeless tobacco. He reports that he does not currently use drugs. He reports that he does not drink alcohol.   Physical Exam: BP 120/82   Pulse 85   Ht 6\' 4"  (1.93 m)   Wt 232 lb (105.2 kg)   BMI 28.24 kg/m   Constitutional:  Alert and oriented, No acute distress. HEENT: [address] AT Respiratory: Normal respiratory effort, no increased work of breathing. GU: Phallus uncircumcised, mild erythema inner prepuce. Prostate 50 grams, smooth without nodules. Psychiatric: Normal mood and affect.   Assessment & Plan:    1. T1c low-risk prostate cancer PSA stable 1.2  Benign DRE Lab visit 6 month PSA; office visit 1 year PSA/DRE  2. BPH with LUTS Stable on tamsulosin/finasteride, refill sent to pharmacy.   3. Balanoposthitis Rx Mycolog II twice daily x7 days.   I have reviewed the above documentation for accuracy and completeness, and I agree with the above.   Riki Altes, MD  [facility] [address], [address] [place], [place] [address] [phone]

## 2023-05-14 ENCOUNTER — Other Ambulatory Visit: Payer: Self-pay | Admitting: Internal Medicine

## 2023-05-14 DIAGNOSIS — F17210 Nicotine dependence, cigarettes, uncomplicated: Secondary | ICD-10-CM

## 2023-05-14 DIAGNOSIS — R109 Unspecified abdominal pain: Secondary | ICD-10-CM

## 2023-06-01 ENCOUNTER — Ambulatory Visit
Admission: RE | Admit: 2023-06-01 | Discharge: 2023-06-01 | Disposition: A | Discharge status: Home / Self Care | Payer: BC Managed Care – PPO | Source: Ambulatory Visit | Attending: Internal Medicine | Admitting: Internal Medicine

## 2023-06-01 DIAGNOSIS — F17210 Nicotine dependence, cigarettes, uncomplicated: Secondary | ICD-10-CM | POA: Diagnosis present

## 2023-06-01 DIAGNOSIS — R109 Unspecified abdominal pain: Secondary | ICD-10-CM | POA: Diagnosis present

## 2023-06-01 LAB — POCT I-STAT CREATININE: Creatinine, Ser: 1.2 mg/dL (ref 0.61–1.24)

## 2023-06-01 MED ORDER — IOHEXOL 300 MG/ML  SOLN
100.0000 mL | Freq: Once | INTRAMUSCULAR | Status: AC | PRN
  Administered 2023-06-01: 100 mL via INTRAVENOUS

## 2023-09-05 ENCOUNTER — Other Ambulatory Visit: Payer: Self-pay

## 2023-09-05 ENCOUNTER — Emergency Department
Admission: EM | Admit: 2023-09-05 | Discharge: 2023-09-06 | Disposition: A | Discharge status: Home / Self Care | Payer: BC Managed Care – PPO | Attending: Emergency Medicine | Admitting: Emergency Medicine

## 2023-09-05 DIAGNOSIS — K859 Acute pancreatitis without necrosis or infection, unspecified: Secondary | ICD-10-CM | POA: Diagnosis not present

## 2023-09-05 DIAGNOSIS — R1010 Upper abdominal pain, unspecified: Secondary | ICD-10-CM | POA: Diagnosis present

## 2023-09-05 LAB — CBC
HCT: 44.1 % (ref 39.0–52.0)
Hemoglobin: 14.4 g/dL (ref 13.0–17.0)
MCH: 29.3 pg (ref 26.0–34.0)
MCHC: 32.7 g/dL (ref 30.0–36.0)
MCV: 89.6 fL (ref 80.0–100.0)
Platelets: 259 K/uL (ref 150–400)
RBC: 4.92 MIL/uL (ref 4.22–5.81)
RDW: 13.2 % (ref 11.5–15.5)
WBC: 9.5 K/uL (ref 4.0–10.5)
nRBC: 0 % (ref 0.0–0.2)

## 2023-09-05 LAB — URINALYSIS, ROUTINE W REFLEX MICROSCOPIC
Bilirubin Urine: NEGATIVE
Glucose, UA: NEGATIVE mg/dL
Hgb urine dipstick: NEGATIVE
Ketones, ur: NEGATIVE mg/dL
Leukocytes,Ua: NEGATIVE
Nitrite: NEGATIVE
Protein, ur: NEGATIVE mg/dL
Specific Gravity, Urine: 1.004 — ABNORMAL LOW (ref 1.005–1.030)
pH: 6 (ref 5.0–8.0)

## 2023-09-05 LAB — COMPREHENSIVE METABOLIC PANEL WITH GFR
ALT: 28 U/L (ref 0–44)
AST: 19 U/L (ref 15–41)
Albumin: 4.3 g/dL (ref 3.5–5.0)
Alkaline Phosphatase: 41 U/L (ref 38–126)
Anion gap: 9 (ref 5–15)
BUN: 14 mg/dL (ref 8–23)
CO2: 27 mmol/L (ref 22–32)
Calcium: 8.9 mg/dL (ref 8.9–10.3)
Chloride: 97 mmol/L — ABNORMAL LOW (ref 98–111)
Creatinine, Ser: 1.05 mg/dL (ref 0.61–1.24)
GFR, Estimated: >60 mL/min
Glucose, Bld: 165 mg/dL — ABNORMAL HIGH (ref 70–99)
Potassium: 3.8 mmol/L (ref 3.5–5.1)
Sodium: 133 mmol/L — ABNORMAL LOW (ref 135–145)
Total Bilirubin: 0.7 mg/dL (ref 0.0–1.2)
Total Protein: 6.9 g/dL (ref 6.5–8.1)

## 2023-09-05 LAB — LIPASE, BLOOD: Lipase: 90 U/L — ABNORMAL HIGH (ref 11–51)

## 2023-09-05 MED ORDER — ONDANSETRON HCL 4 MG/2ML IJ SOLN
4.0000 mg | INTRAMUSCULAR | Status: AC
  Administered 2023-09-05: 4 mg via INTRAVENOUS
  Filled 2023-09-05: qty 2

## 2023-09-05 MED ORDER — MORPHINE SULFATE (PF) 4 MG/ML IV SOLN
4.0000 mg | Freq: Once | INTRAVENOUS | Status: AC
  Administered 2023-09-05: 4 mg via INTRAVENOUS
  Filled 2023-09-05: qty 1

## 2023-09-05 MED ORDER — SODIUM CHLORIDE 0.9 % IV BOLUS
1000.0000 mL | Freq: Once | INTRAVENOUS | Status: AC
  Administered 2023-09-05: 1000 mL via INTRAVENOUS

## 2023-09-05 NOTE — ED Triage Notes (Signed)
Pt reports abd pain that began yesterday and has gotten worse today. Pt denies n/v/d. Hx pancreatitis.

## 2023-09-05 NOTE — ED Provider Notes (Signed)
 [facility] Provider Note    Event Date/Time   First MD Initiated Contact with Patient 09/05/23 2334     (approximate)   History   Abdominal Pain   HPI  Joseph Daugherty is a 63 y.o. male history of BPH, prior pancreatitis  Patient reports about 2 years ago had an episode of pancreatitis and then about 15 years ago prior around the same time he quit drinking alcohol.  About 2 days ago started having pain in the same area mid upper abdomen with nausea no vomiting.  Decreased appetite.  No fevers or chills.  No vomiting no bloody stool.  Reports decreased appetite due to pain the last couple days.  It comes and goes seems to be worse after he eats.  Located in the mid abdomen points just above his umbilicus     Physical Exam   Triage Vital Signs: ED Triage Vitals  Encounter Vitals Group     BP 09/05/23 2115 (!) 162/104     Systolic BP Percentile --      Diastolic BP Percentile --      Pulse Rate 09/05/23 2115 79     Resp 09/05/23 2115 20     Temp 09/05/23 2115 (!) 97.4 F (36.3 C)     Temp Source 09/05/23 2115 Oral     SpO2 09/05/23 2115 100 %     Weight 09/05/23 2114 246 lb (111.6 kg)     Height 09/05/23 2114 6\' 4"  (1.93 m)     Head Circumference --      Peak Flow --      Pain Score 09/05/23 2114 10     Pain Loc --      Pain Education --      Exclude from Growth Chart --     Most recent vital signs: Vitals:   09/05/23 2115 09/06/23 0125  BP: (!) 162/104 (!) 141/95  Pulse: 79 70  Resp: 20 20  Temp: (!) 97.4 F (36.3 C) 97.7 F (36.5 C)  SpO2: 100% 100%     General: Awake, no distress.  He appears in some pain when he moves side-to-side but otherwise resting comfortably in no acute distress or extremis.  No active emesis CV:  Good peripheral perfusion.  Normal tones and rate Resp:  Normal effort.  Clear bilateral Abd:  No distention.  Moderately tender in the epigastrium slight in the right and left upper quadrants bilaterally.  No  appreciable tenderness in the lower quadrants bilaterally.  Reports pain and points towards his mid upper abdomen Other:  Warm well-perfused extremities   ED Results / Procedures / Treatments   Labs (all labs ordered are listed, but only abnormal results are displayed) Labs Reviewed  LIPASE, BLOOD - Abnormal; Notable for the following components:      Result Value   Lipase 90 (*)    All other components within normal limits  COMPREHENSIVE METABOLIC PANEL - Abnormal; Notable for the following components:   Sodium 133 (*)    Chloride 97 (*)    Glucose, Bld 165 (*)    All other components within normal limits  URINALYSIS, ROUTINE W REFLEX MICROSCOPIC - Abnormal; Notable for the following components:   Color, Urine STRAW (*)    APPearance CLEAR (*)    Specific Gravity, Urine 1.004 (*)    All other components within normal limits  CBC   Labs demonstrate normal LFTs.  CBC normal.  Lipase is mildly elevated concerning for potential pancreatic  abnormality  RADIOLOGY  CT ABDOMEN PELVIS W CONTRAST Result Date: 09/06/2023 CLINICAL DATA:  Abdominal pain. EXAM: CT ABDOMEN AND PELVIS WITH CONTRAST TECHNIQUE: Multidetector CT imaging of the abdomen and pelvis was performed using the standard protocol following bolus administration of intravenous contrast. RADIATION DOSE REDUCTION: This exam was performed according to the departmental dose-optimization program which includes automated exposure control, adjustment of the mA and/or kV according to patient size and/or use of iterative reconstruction technique. CONTRAST:  OMNIPAQUE IOHEXOL 350 MG/ML SOLN COMPARISON:  June 01, 2023 FINDINGS: Lower chest: No acute abnormality. Hepatobiliary: No focal liver abnormality is seen. No gallstones, gallbladder wall thickening, or biliary dilatation. Pancreas: Very mild peripancreatic inflammatory fat stranding is seen adjacent to the head of the pancreas. This extends along the region in between the  pancreatic head and proximal duodenum. There is no evidence of pancreatic ductal dilatation. Spleen: No splenic injury or perisplenic hematoma. Adrenals/Urinary Tract: Mild adrenal gland thickening is noted. Kidneys are normal, without renal calculi, focal lesion, or hydronephrosis. Bladder is unremarkable. Stomach/Bowel: Stomach is within normal limits. Appendix appears normal. No evidence of bowel dilatation. A short segment mildly thickened, poorly distended proximal duodenum is seen, adjacent to the head of the pancreas. Vascular/Lymphatic: Aortic atherosclerosis. No enlarged abdominal or pelvic lymph nodes. Reproductive: The prostate gland is mildly enlarged. Other: No abdominal wall hernia or abnormality. No abdominopelvic ascites. Musculoskeletal: No acute or significant osseous findings. IMPRESSION: 1. Findings which may represent mild pancreatitis. Correlation with pancreatic enzymes is recommended. 2. Short segment mildly thickened, poorly distended proximal duodenum, adjacent to the head of the pancreas. This may represent reactive inflammatory changes. 3. Mildly enlarged prostate gland. 4. Aortic atherosclerosis. Aortic Atherosclerosis (ICD10-I70.0). Electronically Signed   By: Aram Candela M.D.   On: 09/06/2023 00:46      PROCEDURES:  Critical Care performed: No  Procedures   MEDICATIONS ORDERED IN ED: Medications  ondansetron (ZOFRAN) injection 4 mg (4 mg Intravenous Given 09/05/23 2357)  morphine (PF) 4 MG/ML injection 4 mg (4 mg Intravenous Given 09/05/23 2357)  sodium chloride 0.9 % bolus 1,000 mL (0 mLs Intravenous Stopped 09/06/23 0207)  iohexol (OMNIPAQUE) 350 MG/ML injection 100 mL (100 mLs Intravenous Contrast Given 09/06/23 0023)  HYDROcodone-acetaminophen (NORCO/VICODIN) 5-325 MG per tablet 2 tablet (2 tablets Oral Given 09/06/23 0207)     IMPRESSION / MDM / ASSESSMENT AND PLAN / ED COURSE  I reviewed the triage vital signs and the nursing notes.                               Differential diagnosis includes but is not limited to, abdominal perforation, aortic dissection, cholecystitis, appendicitis, diverticulitis, colitis, esophagitis/gastritis, kidney stone, pyelonephritis, urinary tract infection, aortic aneurysm. All are considered in decision and treatment plan. Based upon the patient's presentation and risk factors, the patient appears to have symptoms of at least what appears clinically most likely mild pancreatitis.  Based on his clinical history close his previous medical history as well as absence from alcohol I suspect it may be related to diet, he reports he had a large meaty sandwich when the pain began.  Other considerations but seem less likely with reassuring labs to be causes such as cholecystitis choledocholithiasis, peptic ulcer disease perforation etc.  Will proceed with CT imaging to further evaluate for cause.  Thankfully he is nontoxic well-appearing appears well-hydrated.  I am hopeful that we will of the pain can control his pain,  affirmed no other associated complicating factors, and potentially consider discharge to home if he is able to continue to keep p.o. down and pain is controllable.  Suspect pancreatitis but further workup pending.  No cardiopulmonary symptoms   Patient's presentation is most consistent with acute complicated illness / injury requiring diagnostic workup.      Clinical Course as of 09/06/23 0336  Thu Sep 06, 2023  0145 Patient's pain has improved still having some epigastric pain but appears much more comfortable.  Will trial oral challenge at this time.  Patient comfortable with plan to discharge with pain medication and diet modification for treatment of mild pancreatitis if he passes p.o. challenge. [MQ]    Clinical Course User Index [MQ] Sharyn Creamer, MD   ----------------------------------------- 3:35 AM on 09/06/2023 ----------------------------------------- Imaging and history consistent with acute  pancreatitis.  Patient tolerating by mouth taking crackers drinking 2 cups of water pain well-controlled after hydrocodone.  No complications that would require hospitalization noted at this time.  He is fully alert and oriented also had any discussed with his girlfriend, Hilda Lias, will be coming to pick him up.  He advises he will follow-up with his primary and already has a GI appointment scheduled coming up soon.  Discussed careful return precautions as well as use of hydrocodone only as prescribed and never driving or working with dangerous equipment while taking this.  Patient agreeable  Return precautions and treatment recommendations and follow-up discussed with the patient who is agreeable with the plan.   FINAL CLINICAL IMPRESSION(S) / ED DIAGNOSES   Final diagnoses:  Acute pancreatitis without infection or necrosis, unspecified pancreatitis type     Rx / DC Orders   ED Discharge Orders          Ordered    HYDROcodone-acetaminophen (NORCO/VICODIN) 5-325 MG tablet  Every 6 hours PRN        09/06/23 0334    ondansetron (ZOFRAN-ODT) 4 MG disintegrating tablet  Every 6 hours PRN        09/06/23 0334             Note:  This document was prepared using Dragon voice recognition software and may include unintentional dictation errors.   Sharyn Creamer, MD 09/06/23 [phone]

## 2023-09-06 ENCOUNTER — Emergency Department: Payer: BC Managed Care – PPO

## 2023-09-06 MED ORDER — IOHEXOL 350 MG/ML SOLN
100.0000 mL | Freq: Once | INTRAVENOUS | Status: AC | PRN
  Administered 2023-09-06: 100 mL via INTRAVENOUS

## 2023-09-06 MED ORDER — ONDANSETRON 4 MG PO TBDP: 4.0000 mg | ORAL_TABLET | Freq: Four times a day (QID) | ORAL | 0 refills | Status: DC | PRN

## 2023-09-06 MED ORDER — HYDROCODONE-ACETAMINOPHEN 5-325 MG PO TABS
1.0000 | ORAL_TABLET | Freq: Four times a day (QID) | ORAL | 0 refills | Status: DC | PRN
Start: 2023-09-06 — End: 2023-11-23

## 2023-09-06 MED ORDER — HYDROCODONE-ACETAMINOPHEN 5-325 MG PO TABS
2.0000 | ORAL_TABLET | Freq: Once | ORAL | Status: AC
  Administered 2023-09-06: 2 via ORAL
  Filled 2023-09-06: qty 2

## 2023-09-06 NOTE — Discharge Instructions (Signed)
No driving while taking hydrocodone or within 8 hours. Follow-up with Dr. Nemiah Commander and GI (as already planned).  Please return to the emergency room right away if you are to develop a fever, severe nausea, your pain becomes severe or worsens, you are unable to keep food down, begin vomiting any dark or bloody fluid, you develop any dark or bloody stools, feel dehydrated, or other new concerns or symptoms arise.

## 2023-09-06 NOTE — ED Notes (Signed)
Pt tolerated medications, crackers and water without further issues.

## 2023-09-06 NOTE — ED Notes (Signed)
Patient provided water for PO challenge

## 2023-10-19 ENCOUNTER — Other Ambulatory Visit: Payer: BC Managed Care – PPO

## 2023-10-19 DIAGNOSIS — C61 Malignant neoplasm of prostate: Secondary | ICD-10-CM

## 2023-10-20 LAB — PSA: Prostate Specific Ag, Serum: 1.3 ng/mL (ref 0.0–4.0)

## 2023-10-21 ENCOUNTER — Encounter: Payer: Self-pay | Admitting: Urology

## 2023-10-22 ENCOUNTER — Other Ambulatory Visit: Payer: Self-pay

## 2023-10-28 ENCOUNTER — Emergency Department

## 2023-10-28 ENCOUNTER — Emergency Department
Admission: EM | Admit: 2023-10-28 | Discharge: 2023-10-28 | Disposition: A | Discharge status: Home / Self Care | Attending: Emergency Medicine | Admitting: Emergency Medicine

## 2023-10-28 ENCOUNTER — Other Ambulatory Visit: Payer: Self-pay

## 2023-10-28 DIAGNOSIS — R0602 Shortness of breath: Secondary | ICD-10-CM | POA: Insufficient documentation

## 2023-10-28 DIAGNOSIS — R079 Chest pain, unspecified: Secondary | ICD-10-CM

## 2023-10-28 DIAGNOSIS — M79651 Pain in right thigh: Secondary | ICD-10-CM | POA: Insufficient documentation

## 2023-10-28 DIAGNOSIS — R0789 Other chest pain: Secondary | ICD-10-CM | POA: Insufficient documentation

## 2023-10-28 DIAGNOSIS — E119 Type 2 diabetes mellitus without complications: Secondary | ICD-10-CM | POA: Insufficient documentation

## 2023-10-28 LAB — CBC
HCT: 43.8 % (ref 39.0–52.0)
Hemoglobin: 14.4 g/dL (ref 13.0–17.0)
MCH: 29 pg (ref 26.0–34.0)
MCHC: 32.9 g/dL (ref 30.0–36.0)
MCV: 88.3 fL (ref 80.0–100.0)
Platelets: 271 10*3/uL (ref 150–400)
RBC: 4.96 MIL/uL (ref 4.22–5.81)
RDW: 13.2 % (ref 11.5–15.5)
WBC: 7.8 10*3/uL (ref 4.0–10.5)
nRBC: 0 % (ref 0.0–0.2)

## 2023-10-28 LAB — HEPATIC FUNCTION PANEL
ALT: 19 U/L (ref 0–44)
AST: 20 U/L (ref 15–41)
Albumin: 4.3 g/dL (ref 3.5–5.0)
Alkaline Phosphatase: 38 U/L (ref 38–126)
Bilirubin, Direct: <0.1 mg/dL (ref 0.0–0.2)
Total Bilirubin: 0.5 mg/dL (ref 0.0–1.2)
Total Protein: 7.1 g/dL (ref 6.5–8.1)

## 2023-10-28 LAB — BASIC METABOLIC PANEL WITH GFR
Anion gap: 7 (ref 5–15)
BUN: 20 mg/dL (ref 8–23)
CO2: 27 mmol/L (ref 22–32)
Calcium: 9.3 mg/dL (ref 8.9–10.3)
Chloride: 103 mmol/L (ref 98–111)
Creatinine, Ser: 1.12 mg/dL (ref 0.61–1.24)
GFR, Estimated: >60 mL/min (ref >60)
Glucose, Bld: 107 mg/dL — ABNORMAL HIGH (ref 70–99)
Potassium: 4.2 mmol/L (ref 3.5–5.1)
Sodium: 137 mmol/L (ref 135–145)

## 2023-10-28 LAB — D-DIMER, QUANTITATIVE: D-Dimer, Quant: <0.27 ug{FEU}/mL (ref 0.00–0.50)

## 2023-10-28 LAB — TROPONIN I (HIGH SENSITIVITY)
Troponin I (High Sensitivity): 8 ng/L (ref <18)
Troponin I (High Sensitivity): 8 ng/L

## 2023-10-28 LAB — LIPASE, BLOOD: Lipase: 35 U/L (ref 11–51)

## 2023-10-28 MED ORDER — ACETAMINOPHEN 500 MG PO TABS
1000.0000 mg | ORAL_TABLET | Freq: Once | ORAL | Status: AC
  Administered 2023-10-28: 1000 mg via ORAL
  Filled 2023-10-28: qty 2

## 2023-10-28 MED ORDER — LIDOCAINE 5 % EX PTCH
1.0000 | MEDICATED_PATCH | CUTANEOUS | Status: DC
  Administered 2023-10-28: 1 via TRANSDERMAL
  Filled 2023-10-28: qty 1

## 2023-10-28 MED ORDER — LIDOCAINE 5 % EX PTCH: 1.0000 | MEDICATED_PATCH | Freq: Two times a day (BID) | CUTANEOUS | 0 refills | Status: AC

## 2023-10-28 MED ORDER — MORPHINE SULFATE (PF) 4 MG/ML IV SOLN
4.0000 mg | Freq: Once | INTRAVENOUS | Status: AC
  Administered 2023-10-28: 4 mg via INTRAVENOUS
  Filled 2023-10-28: qty 1

## 2023-10-28 MED ORDER — ASPIRIN 81 MG PO CHEW
243.0000 mg | CHEWABLE_TABLET | Freq: Once | ORAL | Status: AC
  Administered 2023-10-28: 243 mg via ORAL
  Filled 2023-10-28: qty 3

## 2023-10-28 MED ORDER — NITROGLYCERIN 0.4 MG SL SUBL: 0.4000 mg | SUBLINGUAL_TABLET | SUBLINGUAL | Status: DC | PRN

## 2023-10-28 MED ORDER — ONDANSETRON HCL 4 MG/2ML IJ SOLN
4.0000 mg | Freq: Once | INTRAMUSCULAR | Status: AC
  Administered 2023-10-28: 4 mg via INTRAVENOUS
  Filled 2023-10-28: qty 2

## 2023-10-28 MED ORDER — KETOROLAC TROMETHAMINE 15 MG/ML IJ SOLN
15.0000 mg | Freq: Once | INTRAMUSCULAR | Status: AC
  Administered 2023-10-28: 15 mg via INTRAVENOUS
  Filled 2023-10-28: qty 1

## 2023-10-28 NOTE — ED Triage Notes (Signed)
 Pt comes with c/o right sided cp that radiates to arm, neck and jaw. Pt state some sob. Pt states this started yesterday. Pt stats it has gotten worse. Pt states his brother had blocked artery. Pt states some nausea.

## 2023-10-28 NOTE — ED Provider Notes (Signed)
 [facility] Provider Note    Event Date/Time   First MD Initiated Contact with Patient 10/28/23 [phone]     (approximate)   History   Chest Pain   HPI  Joseph Daugherty is a 63 y.o. male with diabetes, pancreatitis who comes in with concerns for chest pain.patient reports some chest pain that started yesterday.  He states that it is a sharp sensation on the right side of his chest, worse with taking a deep breath does have a little bit of shortness of breath associated with it.  He reports chronic issues with his abdomen but denies the pain seeming to come from his upper abdomen.  Reports that many members of his family has had heart problems and so he was worried that he was having a heart attack.  He does report that he is very active and that he was mowing the lawn about a week ago and he thinks that he strained his leg.  He denies any pain in the calf but had some pain on the upper thigh.  He denies any cough, fevers. No radiation of pain to back.  Physical Exam   Triage Vital Signs: ED Triage Vitals  Encounter Vitals Group     BP      Systolic BP Percentile      Diastolic BP Percentile      Pulse      Resp      Temp      Temp src      SpO2      Weight      Height      Head Circumference      Peak Flow      Pain Score      Pain Loc      Pain Education      Exclude from Growth Chart     Most recent vital signs: Vitals:   10/28/23 0835 10/28/23 1130  BP: (!) 151/93 138/82  Pulse: 80 76  Resp: 19 14  Temp: 98 F (36.7 C)   SpO2: 97% 98%     General: Awake, no distress.  CV:  Good peripheral perfusion. No murmur  Resp:  Normal effort. Clear lungs  Abd:  No distention. Soft and non tender  Other:  No swelling legs.  No calf tenderness.  He reports some pain in the upper posterior part of the thigh after doing some heavy laboring. No varicose veins Good requal distal pulses   ED Results / Procedures / Treatments   Labs (all labs ordered  are listed, but only abnormal results are displayed) Labs Reviewed  BASIC METABOLIC PANEL WITH GFR - Abnormal; Notable for the following components:      Result Value   Glucose, Bld 107 (*)    All other components within normal limits  CBC  LIPASE, BLOOD  HEPATIC FUNCTION PANEL  D-DIMER, QUANTITATIVE  TROPONIN I (HIGH SENSITIVITY)  TROPONIN I (HIGH SENSITIVITY)     EKG  My interpretation of EKG:  Normal sinus with a rate of 82 without any ST elevation or T wave inversions, normal intervals  RADIOLOGY I have reviewed the xray personally and interpreted and no PNA    PROCEDURES:  Critical Care performed: No  .1-3 Lead EKG Interpretation  Performed by: Concha Se, MD Authorized by: Concha Se, MD     Interpretation: normal     ECG rate:  70   ECG rate assessment: normal     Rhythm:  sinus rhythm     Ectopy: none     Conduction: normal      MEDICATIONS ORDERED IN ED: Medications  nitroGLYCERIN (NITROSTAT) SL tablet 0.4 mg (has no administration in time range)  lidocaine (LIDODERM) 5 % 1 patch (1 patch Transdermal Patch Applied 10/28/23 1127)  aspirin chewable tablet 243 mg (243 mg Oral Given 10/28/23 0858)  morphine (PF) 4 MG/ML injection 4 mg (4 mg Intravenous Given 10/28/23 0858)  ondansetron (ZOFRAN) injection 4 mg (4 mg Intravenous Given 10/28/23 0858)  ketorolac (TORADOL) 15 MG/ML injection 15 mg (15 mg Intravenous Given 10/28/23 1127)  acetaminophen (TYLENOL) tablet 1,000 mg (1,000 mg Oral Given 10/28/23 1127)     IMPRESSION / MDM / ASSESSMENT AND PLAN / ED COURSE  I reviewed the triage vital signs and the nursing notes.   Patient's presentation is most consistent with acute presentation with potential threat to life or bodily function.      INITIAL IMPRESSION / ASSESSMENT AND PLAN / ED COURSE   Most Likely DDx:  -Consider ACS vs MSK Vs Genella Rife- will get EKG/troponin to evaluate for ACS - PE will give Ddimer (no signs of dvt on exam and Low modified well  score -1 but given the report of some thigh pain after exertion will use ddimer to see if US/CT is warranted. -no overt upper abdominal pain but given pancreatitis history will get lipase/Lfts   DDx that was also considered d/t potential to cause harm, but was found less likely based on history and physical (as detailed above): -PNA (no fevers, cough but CXR to evaluate) -PNX (reassured with equal b/l breath sounds, CXR to evaluate) -Symptomatic anemia (will get H&H) -Pulmonary embolism as no sob at rest, not pleuritic in nature, no hypoxia -Aortic Dissection as no tearing pain and no radiation to the mid back, pulses equal, no murmur -Pericarditis no EKG changes or hx to suggest dx -Tamponade (no notable SOB, tachycardic, hypotensive) -Esophageal rupture (no h/o diffuse vomitting/no crepitus)    10:38 AM repeat evaluation patient reports feeling better. DDImer negative doubt PE or DVTs given no evidence of exam plus re-assuring ddimer. He states that he does still have some pain but he notices it is worse with certain movement and with certain pressure.  I have reviewed him again and he does have some chest wall tenderness on the right side and I wonder if this could be more musculoskeletal however given his risk factors I still think he needs a second troponin as well as a cardiology follow-up.   I have considered admission for patient, but given re-assuring workup including EKG, troponin, and re-assuring vitals patient can follow up outpatient with cardiology.   11:47 AM patient reports feeling much better after lidocaine patch and additional medications.  Feels comfortable with outpatient cardiology follow-up.  Request going to [facility] clinic.   Discussed with patient that I can not predict future heart attacks and that cardiology can evaluate for need for further workup including stress test.  Explained to patient that if there is a change in symptoms, worsening symptoms, or any other concerns  that they should return for repeat evaluation to have repeat EKG/troponin. They expressed understanding.  ____________________________________________   Note:  This document was prepared using Dragon voice recognition software and may include unintentional dictation errors.   The patient is on the cardiac monitor to evaluate for evidence of arrhythmia and/or significant heart rate changes.      FINAL CLINICAL IMPRESSION(S) / ED DIAGNOSES   Final  diagnoses:  Chest pain, unspecified type     Rx / DC Orders   ED Discharge Orders          Ordered    lidocaine (LIDODERM) 5 %  Every 12 hours        10/28/23 1143             Note:  This document was prepared using Dragon voice recognition software and may include unintentional dictation errors.   Concha Se, MD 10/28/23 [phone]

## 2023-10-28 NOTE — ED Notes (Signed)
Transferred to Xray 

## 2023-10-28 NOTE — Discharge Instructions (Addendum)
 Please call the [facility] clinic cardiologist to make a follow-up appointment state that you need an ER follow-up.  If they take a while to get seen you can also instead follow-up with Dr. Santo Held office and they can get people in usually 1 to 2 days.  Develop return of chest pain he should return to the ER given your risk factors I cannot predict Future heart attacks.  You can use the lidocaine patches, Tylenol 1 g every 8 hours, ibuprofen 600 every 8 hours with food to help with any pain for the next 1 week.

## 2023-10-30 ENCOUNTER — Encounter: Payer: Self-pay | Admitting: Cardiovascular Disease

## 2023-10-30 ENCOUNTER — Ambulatory Visit: Admitting: Cardiovascular Disease

## 2023-10-30 VITALS — BP 136/84 | HR 85 | Ht 76.0 in | Wt 238.0 lb

## 2023-10-30 DIAGNOSIS — E782 Mixed hyperlipidemia: Secondary | ICD-10-CM

## 2023-10-30 DIAGNOSIS — I10 Essential (primary) hypertension: Secondary | ICD-10-CM | POA: Diagnosis not present

## 2023-10-30 DIAGNOSIS — F17200 Nicotine dependence, unspecified, uncomplicated: Secondary | ICD-10-CM | POA: Diagnosis not present

## 2023-10-30 DIAGNOSIS — R0789 Other chest pain: Secondary | ICD-10-CM

## 2023-10-30 DIAGNOSIS — R9431 Abnormal electrocardiogram [ECG] [EKG]: Secondary | ICD-10-CM

## 2023-10-30 DIAGNOSIS — F17210 Nicotine dependence, cigarettes, uncomplicated: Secondary | ICD-10-CM

## 2023-10-30 NOTE — Progress Notes (Signed)
 Cardiology Office Note   Date:  10/30/2023   ID:  Joseph Daugherty, DOB 08/07/1960, MRN [id number]  PCP:  Enid Baas, MD  Cardiologist:  Adrian Blackwater, MD      History of Present Illness: Joseph Daugherty is a 63 y.o. male who presents for  Chief Complaint  Patient presents with   Follow-up    Hospital Follow up     49 YOBM presented from ER foe evaluation of chest pain after being r/o for MI this Sunday. 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  Chest Pain  This is a new problem. The current episode started in the past 7 days. The onset quality is sudden. The problem has been waxing and waning. The pain is present in the substernal region. The pain is at a severity of 6/10. The quality of the pain is described as dull and tightness. Associated symptoms include shortness of breath. Pertinent negatives include no diaphoresis, dizziness, exertional chest pressure, orthopnea, palpitations, PND, syncope or vomiting.      Past Medical History:  Diagnosis Date   Diabetes mellitus without complication (HCC)    Hyperlipidemia    Hypertension      Past Surgical History:  Procedure Laterality Date   PROSTATE BIOPSY  01/30/2020     Current Outpatient Medications  Medication Sig Dispense Refill   amLODipine (NORVASC) 10 MG tablet Take 1 tablet by mouth daily.     atenolol (TENORMIN) 50 MG tablet Take 50 mg by mouth daily.     celecoxib (CELEBREX) 200 MG capsule Take 200 mg by mouth daily.     finasteride (PROSCAR) 5 MG tablet Take 1 tablet (5 mg total) by mouth daily. 90 tablet 3   glipiZIDE (GLUCOTROL XL) 5 MG 24 hr tablet Take 5 mg by mouth daily.     lidocaine (LIDODERM) 5 % Place 1 patch onto the skin every 12 (twelve) hours for 5 days. Remove & Discard patch within 12 hours or as directed by MD and leave off 12 hours before placing new patch 10 patch 0   lisinopril-hydrochlorothiazide (ZESTORETIC) 20-12.5 MG tablet Take 1 tablet by mouth daily.     metFORMIN  (GLUCOPHAGE) 1000 MG tablet Take 1,000 mg by mouth 2 (two) times daily.     metoprolol succinate (TOPROL-XL) 25 MG 24 hr tablet Take 25 mg by mouth daily.     rosuvastatin (CRESTOR) 10 MG tablet Take 10 mg by mouth daily.     sitaGLIPtin-metformin (JANUMET) 50-1000 MG tablet Take 1 tablet by mouth 2 (two) times daily with a meal.     tamsulosin (FLOMAX) 0.4 MG CAPS capsule Take 1 capsule (0.4 mg total) by mouth daily. 90 capsule 3   cyclobenzaprine (FLEXERIL) 5 MG tablet Take by mouth. (Patient not taking: Reported on 10/30/2023)     dapagliflozin propanediol (FARXIGA) 10 MG TABS tablet Take 10 mg by mouth daily. (Patient not taking: Reported on 10/30/2023)     Fluticasone-Salmeterol (ADVAIR) 100-50 MCG/DOSE AEPB Inhale into the lungs.     HYDROcodone-acetaminophen (NORCO/VICODIN) 5-325 MG tablet Take 1-2 tablets by mouth every 6 (six) hours as needed for moderate pain (pain score 4-6). (Patient not taking: Reported on 10/30/2023) 16 tablet 0   ibuprofen (ADVIL,MOTRIN) 600 MG tablet Take 1 tablet (600 mg total) by mouth every 6 (six) hours as needed. (Patient not taking: Reported on 10/30/2023) 30 tablet 0   lisinopril (PRINIVIL,ZESTRIL) 40 MG tablet Take 40 mg by mouth daily.  meloxicam (MOBIC) 15 MG tablet Take 15 mg by mouth daily. (Patient not taking: Reported on 10/30/2023)     naproxen (NAPROSYN) 500 MG tablet Take 1 tablet (500 mg total) by mouth 2 (two) times daily with a meal. (Patient not taking: Reported on 10/30/2023) 20 tablet 0   ondansetron (ZOFRAN-ODT) 4 MG disintegrating tablet Take 1 tablet (4 mg total) by mouth every 6 (six) hours as needed for nausea or vomiting. (Patient not taking: Reported on 10/30/2023) 20 tablet 0   sildenafil (VIAGRA) 100 MG tablet TAKE (1) TABLET BY MOUTH 1 HOUR PRIOR TO SEXUAL ACTIVITY. ~MAX OF 1 TAB PER DAY~     No current facility-administered medications for this visit.    Allergies:   Penicillins    Social History:   reports that he has been smoking  cigarettes. He has never used smokeless tobacco. He reports that he does not currently use drugs. He reports that he does not drink alcohol.   Family History:  family history is not on file.    ROS:     Review of Systems  Constitutional: Negative.  Negative for diaphoresis.  HENT: Negative.    Eyes: Negative.   Respiratory:  Positive for shortness of breath.   Cardiovascular:  Positive for chest pain. Negative for palpitations, orthopnea, syncope and PND.  Gastrointestinal: Negative.  Negative for vomiting.  Genitourinary: Negative.   Musculoskeletal: Negative.   Skin: Negative.   Neurological: Negative.  Negative for dizziness.  Endo/Heme/Allergies: Negative.   Psychiatric/Behavioral: Negative.    All other systems reviewed and are negative.     All other systems are reviewed and negative.    PHYSICAL EXAM: VS:  BP 136/84   Pulse 85   Ht 6\' 4"  (1.93 m)   Wt 238 lb (108 kg)   SpO2 96%   BMI 28.97 kg/m  , BMI Body mass index is 28.97 kg/m. Last weight:  Wt Readings from Last 3 Encounters:  10/30/23 238 lb (108 kg)  10/28/23 239 lb (108.4 kg)  09/05/23 246 lb (111.6 kg)     Physical Exam Vitals reviewed.  Constitutional:      Appearance: Normal appearance. He is normal weight.  HENT:     Head: Normocephalic.     Nose: Nose normal.     Mouth/Throat:     Mouth: Mucous membranes are moist.  Eyes:     Pupils: Pupils are equal, round, and reactive to light.  Cardiovascular:     Rate and Rhythm: Normal rate and regular rhythm.     Pulses: Normal pulses.     Heart sounds: Normal heart sounds.  Pulmonary:     Effort: Pulmonary effort is normal.  Abdominal:     General: Abdomen is flat. Bowel sounds are normal.  Musculoskeletal:        General: Normal range of motion.     Cervical back: Normal range of motion.  Skin:    General: Skin is warm.  Neurological:     General: No focal deficit present.     Mental Status: He is alert.  Psychiatric:        Mood and  Affect: Mood normal.       EKG: NSR LAW, PRWP suggestive ASWMI on 10/28/23 ER EKG  Recent Labs: 10/28/2023: ALT 19; BUN 20; Creatinine, Ser 1.12; Hemoglobin 14.4; Platelets 271; Potassium 4.2; Sodium 137    Lipid Panel No results found for: "CHOL", "TRIG", "HDL", "CHOLHDL", "VLDL", "LDLCALC", "LDLDIRECT"    Other studies Reviewed: Additional studies/  records that were reviewed today include:  Review of the above records demonstrates:       No data to display            ASSESSMENT AND PLAN:    ICD-10-CM   1. Mixed hyperlipidemia  E78.2 PCV ECHOCARDIOGRAM COMPLETE    MYOCARDIAL PERFUSION IMAGING    2. Current smoker  F17.200 PCV ECHOCARDIOGRAM COMPLETE    MYOCARDIAL PERFUSION IMAGING    3. Cigarette nicotine dependence without complication  F17.210 PCV ECHOCARDIOGRAM COMPLETE    MYOCARDIAL PERFUSION IMAGING    4. Abnormal EKG  R94.31 PCV ECHOCARDIOGRAM COMPLETE    MYOCARDIAL PERFUSION IMAGING    5. Hypertension, benign  I10 PCV ECHOCARDIOGRAM COMPLETE    MYOCARDIAL PERFUSION IMAGING    6. 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 Also get echo.       Problem List Items Addressed This Visit       Cardiovascular and Mediastinum   Hypertension, benign   Relevant Orders   PCV ECHOCARDIOGRAM COMPLETE   MYOCARDIAL PERFUSION IMAGING     Other   Abnormal EKG   Relevant Orders   PCV ECHOCARDIOGRAM COMPLETE   MYOCARDIAL PERFUSION IMAGING   Current smoker   Relevant Orders   PCV ECHOCARDIOGRAM COMPLETE   MYOCARDIAL PERFUSION IMAGING   HLD (hyperlipidemia) - Primary   Relevant Orders   PCV ECHOCARDIOGRAM COMPLETE   MYOCARDIAL PERFUSION IMAGING   Nicotine dependence   Relevant Orders   PCV ECHOCARDIOGRAM COMPLETE   MYOCARDIAL PERFUSION IMAGING   Other Visit Diagnoses       Other chest pain       Has chest pain anginal  type with 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 Also get echo.   Relevant Orders   PCV ECHOCARDIOGRAM COMPLETE   MYOCARDIAL PERFUSION IMAGING          Disposition:   Return in about 2 weeks (around 11/13/2023) for echo, stress test and f/u.    Total time spent: 50 minutes  Signed,  Adrian Blackwater, MD  10/30/2023 2:01 PM    [facility]

## 2023-11-07 ENCOUNTER — Emergency Department
Admission: EM | Admit: 2023-11-07 | Discharge: 2023-11-07 | Disposition: A | Discharge status: Home / Self Care | Attending: Emergency Medicine | Admitting: Emergency Medicine

## 2023-11-07 ENCOUNTER — Encounter: Payer: Self-pay | Admitting: *Deleted

## 2023-11-07 ENCOUNTER — Emergency Department

## 2023-11-07 ENCOUNTER — Other Ambulatory Visit: Payer: Self-pay

## 2023-11-07 DIAGNOSIS — E119 Type 2 diabetes mellitus without complications: Secondary | ICD-10-CM | POA: Diagnosis not present

## 2023-11-07 DIAGNOSIS — I10 Essential (primary) hypertension: Secondary | ICD-10-CM | POA: Insufficient documentation

## 2023-11-07 DIAGNOSIS — R0789 Other chest pain: Secondary | ICD-10-CM

## 2023-11-07 DIAGNOSIS — J189 Pneumonia, unspecified organism: Secondary | ICD-10-CM

## 2023-11-07 DIAGNOSIS — J181 Lobar pneumonia, unspecified organism: Secondary | ICD-10-CM | POA: Insufficient documentation

## 2023-11-07 LAB — BASIC METABOLIC PANEL WITH GFR
Anion gap: 10 (ref 5–15)
BUN: 20 mg/dL (ref 8–23)
CO2: 24 mmol/L (ref 22–32)
Calcium: 9.1 mg/dL (ref 8.9–10.3)
Chloride: 101 mmol/L (ref 98–111)
Creatinine, Ser: 1.15 mg/dL (ref 0.61–1.24)
GFR, Estimated: >60 mL/min (ref >60)
Glucose, Bld: 116 mg/dL — ABNORMAL HIGH (ref 70–99)
Potassium: 3.6 mmol/L (ref 3.5–5.1)
Sodium: 135 mmol/L (ref 135–145)

## 2023-11-07 LAB — CBC
HCT: 42 % (ref 39.0–52.0)
Hemoglobin: 13.5 g/dL (ref 13.0–17.0)
MCH: 28.8 pg (ref 26.0–34.0)
MCHC: 32.1 g/dL (ref 30.0–36.0)
MCV: 89.7 fL (ref 80.0–100.0)
Platelets: 237 10*3/uL (ref 150–400)
RBC: 4.68 MIL/uL (ref 4.22–5.81)
RDW: 13.2 % (ref 11.5–15.5)
WBC: 10.6 10*3/uL — ABNORMAL HIGH (ref 4.0–10.5)
nRBC: 0 % (ref 0.0–0.2)

## 2023-11-07 LAB — TROPONIN I (HIGH SENSITIVITY)
Troponin I (High Sensitivity): 7 ng/L (ref <18)
Troponin I (High Sensitivity): 8 ng/L (ref <18)

## 2023-11-07 MED ORDER — DOXYCYCLINE HYCLATE 100 MG PO TABS
100.0000 mg | ORAL_TABLET | Freq: Two times a day (BID) | ORAL | 0 refills | Status: AC
Start: 2023-11-07 — End: 2023-11-14

## 2023-11-07 MED ORDER — KETOROLAC TROMETHAMINE 30 MG/ML IJ SOLN
15.0000 mg | Freq: Once | INTRAMUSCULAR | Status: AC
  Administered 2023-11-07: 15 mg via INTRAVENOUS
  Filled 2023-11-07: qty 1

## 2023-11-07 MED ORDER — DOXYCYCLINE HYCLATE 100 MG PO TABS
100.0000 mg | ORAL_TABLET | Freq: Once | ORAL | Status: AC
Start: 2023-11-07 — End: 2023-11-07
  Administered 2023-11-07: 100 mg via ORAL
  Filled 2023-11-07: qty 1

## 2023-11-07 NOTE — ED Triage Notes (Signed)
 Pt to triage via wheelchair.  Pt reports chest pain since 1700.  No sob.  Pt denies n/v  pt alert  speech clear.

## 2023-11-07 NOTE — ED Provider Notes (Signed)
 [facility] Provider Note    Event Date/Time   First MD Initiated Contact with Patient 11/07/23 1912     (approximate)   History   Chest Pain   HPI  Joseph Daugherty is a 63 y.o. male who presents to the ED for evaluation of Chest Pain   I review a routine PCP visit from this morning.  History of HTN, HLD, DM and low back pain.  ED visit 10 days ago for chest and abdominal pain.  Referred to cardiology, saw them in the clinic on 4/8.  Outpatient echo and nuclear stress testing ordered.  Patient presents with recurrence of sharp left-sided chest discomfort.  Reports pain with coughing, as well as a productive cough worsening over the past few days.  Some chills a couple days ago but no documented or consistent fevers.  No syncope.  No shortness of breath.   Physical Exam   Triage Vital Signs: ED Triage Vitals  Encounter Vitals Group     BP 11/07/23 1857 (!) 156/84     Systolic BP Percentile --      Diastolic BP Percentile --      Pulse Rate 11/07/23 1857 82     Resp 11/07/23 1857 (!) 22     Temp 11/07/23 1857 98.1 F (36.7 C)     Temp Source 11/07/23 1857 Oral     SpO2 11/07/23 1857 96 %     Weight 11/07/23 1800 233 lb (105.7 kg)     Height 11/07/23 1800 6\' 4"  (1.93 m)     Head Circumference --      Peak Flow --      Pain Score 11/07/23 1800 10     Pain Loc --      Pain Education --      Exclude from Growth Chart --     Most recent vital signs: Vitals:   11/07/23 1857  BP: (!) 156/84  Pulse: 82  Resp: (!) 22  Temp: 98.1 F (36.7 C)  SpO2: 96%    General: Awake, no distress.  CV:  Good peripheral perfusion.  Resp:  Normal effort.  Abd:  No distention.  MSK:  No deformity noted.  Neuro:  No focal deficits appreciated. Other:     ED Results / Procedures / Treatments   Labs (all labs ordered are listed, but only abnormal results are displayed) Labs Reviewed  BASIC METABOLIC PANEL WITH GFR - Abnormal; Notable for the following  components:      Result Value   Glucose, Bld 116 (*)    All other components within normal limits  CBC - Abnormal; Notable for the following components:   WBC 10.6 (*)    All other components within normal limits  TROPONIN I (HIGH SENSITIVITY)  TROPONIN I (HIGH SENSITIVITY)    EKG Sinus rhythm with a rate of 94 bpm.  Normal axis and intervals.  No clear signs of acute ischemia.  RADIOLOGY 2 view CXR interpreted by me with left perihilar infiltrate  Official radiology report(s): DG Chest 2 View Result Date: 11/07/2023 CLINICAL DATA:  Chest pain. EXAM: CHEST - 2 VIEW COMPARISON:  October 28, 2023 FINDINGS: The heart size and mediastinal contours are within normal limits. An approximately 2.0 cm diameter ill-defined patchy opacity is seen within the perihilar region on the left. This represents a new finding when compared to the prior study. Mild right infrahilar atelectasis and/or infiltrate is also seen. No pleural effusion or pneumothorax is identified. The visualized  skeletal structures are unremarkable. IMPRESSION: 1. Mild right basilar atelectasis and/or infiltrate with interval development of a small infiltrate within the mid left lung since the prior study. Electronically Signed   By: Virgle Grime M.D.   On: 11/07/2023 20:43    PROCEDURES and INTERVENTIONS:  .1-3 Lead EKG Interpretation  Performed by: Arline Bennett, MD Authorized by: Arline Bennett, MD     Interpretation: normal     ECG rate:  80   ECG rate assessment: normal     Rhythm: sinus rhythm     Ectopy: none     Conduction: normal     Medications  doxycycline (VIBRA-TABS) tablet 100 mg (has no administration in time range)  ketorolac (TORADOL) 30 MG/ML injection 15 mg (15 mg Intravenous Given 11/07/23 1942)     IMPRESSION / MDM / ASSESSMENT AND PLAN / ED COURSE  I reviewed the triage vital signs and the nursing notes.  Differential diagnosis includes, but is not limited to, ACS, PTX, PNA, muscle strain/spasm,  PE, dissection, anxiety, pleural effusion  {Patient presents with symptoms of an acute illness or injury that is potentially life-threatening.  Patient recently established with outpatient cardiology presents with atypical chest discomfort and a cough with signs of community-acquired pneumonia suitable for outpatient management with antibiotics.  Reassuring cardiac workup with a nonischemic EKG, 2 negative troponins.  Marginal leukocytosis.  Normal metabolic panel.  I considered observation admission for this patient but has no pain persistently and suspect will be suitable for trial of outpatient management with doxycycline to cover for CAP.  Discussed return precautions and cardiology follow-up.  Clinical Course as of 11/07/23 2055  Wed Nov 07, 2023  2039 Reassessed, feeling better after the Toradol.  We discussed possible etiologies of pain, medications to use at home, ED return precautions, following up with cardiology. [DS]    Clinical Course User Index [DS] Arline Bennett, MD     FINAL CLINICAL IMPRESSION(S) / ED DIAGNOSES   Final diagnoses:  Other chest pain  Community acquired pneumonia of left lower lobe of lung     Rx / DC Orders   ED Discharge Orders          Ordered    doxycycline (VIBRA-TABS) 100 MG tablet  2 times daily        11/07/23 2053             Note:  This document was prepared using Dragon voice recognition software and may include unintentional dictation errors.   Arline Bennett, MD 11/07/23 2055

## 2023-11-07 NOTE — Discharge Instructions (Addendum)
 Use Tylenol for pain and fevers.  Up to 1000 mg per dose, up to 4 times per day.  Do not take more than 4000 mg of Tylenol/acetaminophen within 24 hours..  Use naproxen/Aleve for anti-inflammatory pain relief. Use up to 500mg  every 12 hours. Do not take more frequently than this. Do not use other NSAIDs (ibuprofen, Advil) while taking this medication. It is safe to take Tylenol with this.   Doxycycline antibiotic twice daily for 1 week

## 2023-11-08 ENCOUNTER — Ambulatory Visit

## 2023-11-08 DIAGNOSIS — R0789 Other chest pain: Secondary | ICD-10-CM

## 2023-11-08 DIAGNOSIS — E782 Mixed hyperlipidemia: Secondary | ICD-10-CM

## 2023-11-08 DIAGNOSIS — R9431 Abnormal electrocardiogram [ECG] [EKG]: Secondary | ICD-10-CM

## 2023-11-08 DIAGNOSIS — I10 Essential (primary) hypertension: Secondary | ICD-10-CM

## 2023-11-08 DIAGNOSIS — F17200 Nicotine dependence, unspecified, uncomplicated: Secondary | ICD-10-CM | POA: Diagnosis not present

## 2023-11-08 DIAGNOSIS — F17210 Nicotine dependence, cigarettes, uncomplicated: Secondary | ICD-10-CM

## 2023-11-12 ENCOUNTER — Ambulatory Visit

## 2023-11-12 ENCOUNTER — Other Ambulatory Visit: Payer: Self-pay

## 2023-11-12 ENCOUNTER — Emergency Department

## 2023-11-12 DIAGNOSIS — E119 Type 2 diabetes mellitus without complications: Secondary | ICD-10-CM | POA: Insufficient documentation

## 2023-11-12 DIAGNOSIS — D72829 Elevated white blood cell count, unspecified: Secondary | ICD-10-CM | POA: Insufficient documentation

## 2023-11-12 DIAGNOSIS — Z79899 Other long term (current) drug therapy: Secondary | ICD-10-CM | POA: Diagnosis not present

## 2023-11-12 DIAGNOSIS — E876 Hypokalemia: Secondary | ICD-10-CM | POA: Insufficient documentation

## 2023-11-12 DIAGNOSIS — Z7984 Long term (current) use of oral hypoglycemic drugs: Secondary | ICD-10-CM | POA: Insufficient documentation

## 2023-11-12 DIAGNOSIS — F17200 Nicotine dependence, unspecified, uncomplicated: Secondary | ICD-10-CM | POA: Diagnosis not present

## 2023-11-12 DIAGNOSIS — I10 Essential (primary) hypertension: Secondary | ICD-10-CM | POA: Diagnosis not present

## 2023-11-12 DIAGNOSIS — R079 Chest pain, unspecified: Secondary | ICD-10-CM | POA: Diagnosis present

## 2023-11-12 DIAGNOSIS — R911 Solitary pulmonary nodule: Secondary | ICD-10-CM | POA: Insufficient documentation

## 2023-11-12 LAB — CBC
HCT: 39.1 % (ref 39.0–52.0)
Hemoglobin: 13 g/dL (ref 13.0–17.0)
MCH: 28.9 pg (ref 26.0–34.0)
MCHC: 33.2 g/dL (ref 30.0–36.0)
MCV: 86.9 fL (ref 80.0–100.0)
Platelets: 333 10*3/uL (ref 150–400)
RBC: 4.5 MIL/uL (ref 4.22–5.81)
RDW: 13.1 % (ref 11.5–15.5)
WBC: 10.7 10*3/uL — ABNORMAL HIGH (ref 4.0–10.5)
nRBC: 0 % (ref 0.0–0.2)

## 2023-11-12 LAB — BASIC METABOLIC PANEL WITH GFR
Anion gap: 12 (ref 5–15)
BUN: 21 mg/dL (ref 8–23)
CO2: 25 mmol/L (ref 22–32)
Calcium: 9.3 mg/dL (ref 8.9–10.3)
Chloride: 101 mmol/L (ref 98–111)
Creatinine, Ser: 1.13 mg/dL (ref 0.61–1.24)
GFR, Estimated: >60 mL/min (ref >60)
Glucose, Bld: 128 mg/dL — ABNORMAL HIGH (ref 70–99)
Potassium: 3.1 mmol/L — ABNORMAL LOW (ref 3.5–5.1)
Sodium: 138 mmol/L (ref 135–145)

## 2023-11-12 LAB — TROPONIN I (HIGH SENSITIVITY): Troponin I (High Sensitivity): 7 ng/L (ref <18)

## 2023-11-12 NOTE — ED Triage Notes (Signed)
 Pt to ED via [facility] from home c/o SHOB/CP. Pt having right sided CP. Was here on the 16th for same and was dx with pneumonia. Pt has been taking doxycyline for 5 days. Still coughing up phlegm.

## 2023-11-13 ENCOUNTER — Emergency Department
Admission: EM | Admit: 2023-11-13 | Discharge: 2023-11-13 | Disposition: A | Discharge status: Home / Self Care | Attending: Emergency Medicine | Admitting: Emergency Medicine

## 2023-11-13 ENCOUNTER — Emergency Department

## 2023-11-13 DIAGNOSIS — R918 Other nonspecific abnormal finding of lung field: Secondary | ICD-10-CM

## 2023-11-13 DIAGNOSIS — R079 Chest pain, unspecified: Secondary | ICD-10-CM

## 2023-11-13 LAB — TROPONIN I (HIGH SENSITIVITY): Troponin I (High Sensitivity): 7 ng/L (ref <18)

## 2023-11-13 MED ORDER — POTASSIUM CHLORIDE CRYS ER 20 MEQ PO TBCR
40.0000 meq | EXTENDED_RELEASE_TABLET | Freq: Once | ORAL | Status: AC
  Administered 2023-11-13: 40 meq via ORAL
  Filled 2023-11-13: qty 2

## 2023-11-13 MED ORDER — HYDROCOD POLI-CHLORPHE POLI ER 10-8 MG/5ML PO SUER
5.0000 mL | Freq: Once | ORAL | Status: AC
  Administered 2023-11-13: 5 mL via ORAL
  Filled 2023-11-13: qty 5

## 2023-11-13 MED ORDER — HYDROCOD POLI-CHLORPHE POLI ER 10-8 MG/5ML PO SUER: 5.0000 mL | Freq: Two times a day (BID) | ORAL | 0 refills | Status: DC | PRN

## 2023-11-13 MED ORDER — KETOROLAC TROMETHAMINE 30 MG/ML IJ SOLN
15.0000 mg | Freq: Once | INTRAMUSCULAR | Status: AC
  Administered 2023-11-13: 15 mg via INTRAVENOUS
  Filled 2023-11-13: qty 1

## 2023-11-13 MED ORDER — SODIUM CHLORIDE 0.9 % IV BOLUS
1000.0000 mL | Freq: Once | INTRAVENOUS | Status: AC
  Administered 2023-11-13: 1000 mL via INTRAVENOUS

## 2023-11-13 MED ORDER — IOHEXOL 350 MG/ML SOLN
100.0000 mL | Freq: Once | INTRAVENOUS | Status: AC | PRN
  Administered 2023-11-13: 75 mL via INTRAVENOUS

## 2023-11-13 MED ORDER — MORPHINE SULFATE (PF) 10 MG/ML IV SOLN
15.0000 mg | Freq: Once | INTRAVENOUS | Status: DC
Start: 2023-11-13 — End: 2023-11-13

## 2023-11-13 NOTE — Discharge Instructions (Signed)
 You may take Tussionex as needed.  Return to the ER for worsening symptoms, persistent vomiting, difficulty breathing or other concerns.

## 2023-11-13 NOTE — ED Provider Notes (Signed)
 [facility] Provider Note    Event Date/Time   First MD Initiated Contact with Patient 11/13/23 0210     (approximate)   History   Shortness of Breath and Chest Pain   HPI  Joseph Daugherty is a 63 y.o. male who returns to the ED via EMS for worsening chest pain/shortness of breath.  Patient was seen on the 16th for same and diagnosed with pneumonia.  Has been taking doxycycline  for 5 days.  Reports still coughing up phlegm with chest pain on coughing.  Reports streaks of blood in his sputum.  Takes a baby aspirin  daily.  Denies fever/chills, abdominal pain, nausea/vomiting or dizziness.     Past Medical History   Past Medical History:  Diagnosis Date   Diabetes mellitus without complication (HCC)    Hyperlipidemia    Hypertension      Active Problem List   Patient Active Problem List   Diagnosis Date Noted   Acute pain of right shoulder 01/07/2020   Allergic rhinitis 05/26/2019   ED (erectile dysfunction) of organic origin 05/26/2019   BPH (benign prostatic hyperplasia) 10/21/2018   Chronic obstructive pulmonary disease (HCC) 01/15/2018   Nicotine dependence 01/15/2018   Abnormal EKG 04/11/2017   Type 2 diabetes mellitus without complication, without long-term current use of insulin (HCC) 04/11/2017   CAP (community acquired pneumonia) 10/20/2010   Current smoker 10/20/2010   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 Authorizing Provider  chlorpheniramine-HYDROcodone  (TUSSIONEX) 10-8 MG/5ML Take 5 mLs by mouth every 12 (twelve) hours as needed for cough. 11/13/23  Yes Serenity Batley J, MD  amLODipine (NORVASC) 10 MG tablet Take 1 tablet by mouth  daily. 01/07/20   [provider]  atenolol (TENORMIN) 50 MG tablet Take 50 mg by mouth daily.    [provider]  celecoxib (CELEBREX) 200 MG capsule Take 200 mg by mouth daily. 12/15/21   [provider]  cyclobenzaprine (FLEXERIL) 5 MG tablet Take by mouth. Patient not taking: Reported on 10/30/2023 01/07/20   [provider]  dapagliflozin propanediol (FARXIGA) 10 MG TABS tablet Take 10 mg by mouth daily. Patient not taking: Reported on 10/30/2023    [provider]  doxycycline  (VIBRA -TABS) 100 MG tablet Take 1 tablet (100 mg total) by mouth 2 (two) times daily for 7 days. 11/07/23 11/14/23  Arline Bennett, MD  finasteride  (PROSCAR ) 5 MG tablet Take 1 tablet (5 mg total) by mouth daily. 04/23/23   Stoioff, Kizzie Perks, MD  Fluticasone-Salmeterol (ADVAIR) 100-50 MCG/DOSE AEPB Inhale into the lungs. 01/07/20   [provider]  glipiZIDE (GLUCOTROL XL) 5 MG 24 hr tablet Take 5 mg by mouth daily. 05/28/20   [provider]  HYDROcodone -acetaminophen  (NORCO/VICODIN) 5-325 MG tablet Take 1-2 tablets by mouth every 6 (six) hours as needed for moderate pain (pain score 4-6). Patient not taking: Reported on 10/30/2023 09/06/23   Iver Marker, MD  ibuprofen  (ADVIL ,MOTRIN ) 600 MG tablet Take 1 tablet (600 mg total) by mouth every 6 (six) hours as needed. Patient not taking: Reported on 10/30/2023 09/07/18   Lind Repine, MD  lisinopril (PRINIVIL,ZESTRIL) 40 MG tablet Take 40 mg by mouth daily.    [provider]  lisinopril-hydrochlorothiazide (ZESTORETIC) 20-12.5 MG tablet Take 1 tablet by mouth daily. 05/28/20   [provider]  meloxicam (MOBIC) 15 MG tablet Take 15 mg by mouth daily. Patient not taking: Reported on 10/30/2023    [provider]  metFORMIN (GLUCOPHAGE) 1000 MG tablet Take 1,000 mg by mouth 2 (two) times daily. 05/28/20   [provider]  metoprolol succinate (TOPROL-XL) 25 MG 24 hr tablet Take 25 mg by  mouth daily. 12/13/21   [provider]  naproxen  (NAPROSYN ) 500 MG tablet Take 1 tablet (500 mg total) by mouth 2 (two) times daily with a meal. Patient not taking: Reported on 10/30/2023 01/23/22   Jacquie Maudlin, MD  ondansetron  (ZOFRAN -ODT) 4 MG disintegrating tablet Take 1 tablet (4 mg total) by mouth every 6 (six) hours as needed for nausea or vomiting. Patient not taking: Reported on 10/30/2023 09/06/23   Iver Marker, MD  rosuvastatin (CRESTOR) 10 MG tablet Take 10 mg by mouth daily.    [provider]  sildenafil (VIAGRA) 100 MG tablet TAKE (1) TABLET BY MOUTH 1 HOUR PRIOR TO SEXUAL ACTIVITY. ~MAX OF 1 TAB PER DAY~ 12/10/19   [provider]  sitaGLIPtin-metformin (JANUMET) 50-1000 MG tablet Take 1 tablet by mouth 2 (two) times daily with a meal.    [provider]  tamsulosin  (FLOMAX ) 0.4 MG CAPS capsule Take 1 capsule (0.4 mg total) by mouth daily. 04/23/23   Geraline Knapp, MD     Allergies  Penicillins   Family History  History reviewed. No pertinent family history.   Physical Exam  Triage Vital Signs: ED Triage Vitals  Encounter Vitals Group     BP 11/12/23 2230 129/75     Systolic BP Percentile --      Diastolic BP Percentile --      Pulse Rate 11/12/23 2230 94     Resp 11/12/23 2230 20     Temp 11/12/23 2230 98.4 F (36.9 C)     Temp Source 11/12/23 2230 Oral     SpO2 11/12/23 2230 96 %     Weight --      Height --      Head Circumference --      Peak Flow --      Pain Score 11/12/23 2225 9     Pain Loc --      Pain Education --      Exclude from Growth Chart --     Updated Vital Signs: BP (!) 148/90 (BP Location: Right Arm)   Pulse 70   Temp 98 F (36.7 C) (Oral)   Resp (!) 21   SpO2 100%    General: Awake, mild distress.  CV:  RRR.  Good peripheral perfusion.  Resp:  Normal effort.  CTAB.  Active dry cough noted.  Holding right ribs because it makes them feel better.  Mild splinting.  No crepitus. Abd:  Nontender.   No distention.  Other:  Bilateral calves are supple and nontender.  No pedal edema.   ED Results / Procedures / Treatments  Labs (all labs ordered are listed, but only abnormal results are displayed) Labs Reviewed  BASIC METABOLIC PANEL WITH GFR - Abnormal; Notable for the following components:      Result Value   Potassium 3.1 (*)    Glucose, Bld 128 (*)    All other components within normal limits  CBC - Abnormal; Notable for the following components:   WBC 10.7 (*)    All  other components within normal limits  TROPONIN I (HIGH SENSITIVITY)  TROPONIN I (HIGH SENSITIVITY)     EKG  ED ECG REPORT I, Vernecia Umble J, the attending physician, personally viewed and interpreted this ECG.   Date: 11/13/2023  EKG Time: 2225  Rate: 105  Rhythm: sinus tachycardia  Axis: Normal  Intervals:none  ST&T Change: Nonspecific    RADIOLOGY I have independently visualized interpreted patient's imaging studies as well as noted the radiology interpretation:  CXR: No acute cardiopulmonary process  CTA Chest: No PE, multiple pulmonary nodules concerning for malignancy  Official radiology report(s): CT Angio Chest PE W/Cm &/Or Wo Cm Addendum Date: 11/13/2023 ADDENDUM REPORT: 11/13/2023 04:34 ADDENDUM: 1. Multiple large bilateral pulmonary nodules, as described above, likely consistent with pulmonary metastasis. 2.  No evidence of pulmonary embolism. Electronically Signed   By: Virgle Grime M.D.   On: 11/13/2023 04:34   Result Date: 11/13/2023 CLINICAL DATA:  Chest pain and shortness of breath. EXAM: CT ANGIOGRAPHY CHEST WITH CONTRAST TECHNIQUE: Multidetector CT imaging of the chest was performed using the standard protocol during bolus administration of intravenous contrast. Multiplanar CT image reconstructions and MIPs were obtained to evaluate the vascular anatomy. RADIATION DOSE REDUCTION: This exam was performed according to the departmental dose-optimization program which includes  automated exposure control, adjustment of the mA and/or kV according to patient size and/or use of iterative reconstruction technique. CONTRAST:  75mL OMNIPAQUE  IOHEXOL  350 MG/ML SOLN COMPARISON:  None Available. FINDINGS: Cardiovascular: Satisfactory opacification of the pulmonary arteries to the segmental level. No evidence of pulmonary embolism. Normal heart size. No pericardial effusion. Mediastinum/Nodes: There is mild AP window and bilateral hilar lymphadenopathy. Thyroid gland, trachea, and esophagus demonstrate no significant findings. Lungs/Pleura: A 1.9 cm pulmonary nodule is seen within the anterolateral aspect of the left upper lobe (axial CT images 54 through 65, CT series 5). This corresponds to the focal opacity seen within this region on the earlier chest plain films. A mild amount of surrounding atelectasis and/or infiltrate is seen. There is a 15 mm pulmonary nodule seen within the posterolateral aspect of the right middle lobe (axial CT image 78, CT series 5). A 7 mm anterolateral right lower lobe pulmonary nodule is also noted (axial CT image 69). A 7 mm lung nodule is seen within the lateral aspect of the right lung base (axial CT image 112, CT series 5). There is a 1.5 cm pulmonary nodule within the posteromedial aspect of the left lower lobe (axial CT image 78, CT series 5). Upper Abdomen: There is mild bilateral diffuse adrenal gland enlargement. Musculoskeletal: Multilevel degenerative changes seen throughout the thoracic spine. Review of the MIP images confirms the above findings. IMPRESSION: 1. Multiple large bilateral pulmonary nodules, as described above, likely consistent with pulmonary metastasis. 2. Evidence of pulmonary embolism. Electronically Signed: By: Virgle Grime M.D. On: 11/13/2023 04:10   DG Chest 2 View Result Date: 11/13/2023 CLINICAL DATA:  Recent diagnosis of pneumonia presenting with cough and shortness of breath. EXAM: CHEST - 2 VIEW COMPARISON:  None Available.  FINDINGS: The heart size and mediastinal contours are within normal limits. A predominantly stable ill-defined 2.0 cm diameter focal opacity is seen overlying the mid left lung. No pleural effusion or pneumothorax is identified. The visualized skeletal structures are unremarkable. IMPRESSION: Stable ill-defined focal opacity within the mid left lung which may represent an area of focal infiltrate. CT correlation is recommended, as sequelae associated with an underlying pulmonary nodule cannot be excluded. Electronically Signed   By: Williemae Harsh  [place] M.D.   On: 11/13/2023 04:04     PROCEDURES:  Critical Care performed: No  .1-3 Lead EKG Interpretation  Performed by: Norlene Beavers, MD Authorized by: Norlene Beavers, MD     Interpretation: normal     ECG rate:  95   ECG rate assessment: normal     Rhythm: sinus rhythm     Ectopy: none     Conduction: normal   Comments:     Patient placed on cardiac monitor to evaluate for arrhythmias    MEDICATIONS ORDERED IN ED: Medications  potassium chloride  SA (KLOR-CON  M) CR tablet 40 mEq (40 mEq Oral Given 11/13/23 0300)  sodium chloride  0.9 % bolus 1,000 mL (0 mLs Intravenous Stopped 11/13/23 0534)  chlorpheniramine-HYDROcodone  (TUSSIONEX) 10-8 MG/5ML suspension 5 mL (5 mLs Oral Given 11/13/23 0302)  ketorolac  (TORADOL ) 30 MG/ML injection 15 mg (15 mg Intravenous Given 11/13/23 0301)  iohexol  (OMNIPAQUE ) 350 MG/ML injection 100 mL (75 mLs Intravenous Contrast Given 11/13/23 0313)     IMPRESSION / MDM / ASSESSMENT AND PLAN / ED COURSE  I reviewed the triage vital signs and the nursing notes.                             63 year old male who returns to the ED for continued chest pain and shortness of breath. Differential diagnosis includes, but is not limited to, ACS, aortic dissection, pulmonary embolism, cardiac tamponade, pneumothorax, pneumonia, pericarditis, myocarditis, GI-related causes including esophagitis/gastritis, and musculoskeletal chest  wall pain.   I personally reviewed patient's records and note his ED visit from 11/07/2023.  Patient's presentation is most consistent with acute complicated illness / injury requiring diagnostic workup.  The patient is on the cardiac monitor to evaluate for evidence of arrhythmia and/or significant heart rate changes.  Laboratory results demonstrate minimal leukocytosis with WBC 10.7, mild hypokalemia with potassium 3.1.  Initial troponin negative.  Chest x-ray looks similar to prior.  Will obtain CTA chest to evaluate for PE, hemoptysis, infection.  Administer IV fluids, Ketorolac /Tussionex for pain and cough, replete potassium orally.  Will reassess.  Clinical Course as of 11/13/23 9629  Tue Nov 13, 2023  0510 Patient sleeping soundly after administration of Tussionex.  States he feels great.  Discussed with patient results of CTA chest which demonstrates no PE but more pulmonary nodules than a CT from November 2024.  This is concerning for malignancy.  Will refer patient to the pulmonary nodule clinic for close follow-up.  Will prescribe Tussionex for home use as needed.  Strict return precautions given.  Patient verbalizes understanding and agrees with plan of care. [JS]    Clinical Course User Index [JS] Norlene Beavers, MD     FINAL CLINICAL IMPRESSION(S) / ED DIAGNOSES   Final diagnoses:  Chest pain, unspecified type  Pulmonary nodules     Rx / DC Orders   ED Discharge Orders          Ordered    AMB  Referral to Pulmonary Nodule Clinic        11/13/23 0511    chlorpheniramine-HYDROcodone  (TUSSIONEX) 10-8 MG/5ML  Every 12 hours PRN        11/13/23 5284             Note:  This document was prepared using Dragon voice recognition software and may include unintentional dictation errors.   Norlene Beavers, MD 11/13/23 [phone]

## 2023-11-15 ENCOUNTER — Ambulatory Visit: Admitting: Cardiovascular Disease

## 2023-11-19 ENCOUNTER — Ambulatory Visit

## 2023-11-19 DIAGNOSIS — R0789 Other chest pain: Secondary | ICD-10-CM | POA: Diagnosis not present

## 2023-11-19 DIAGNOSIS — I10 Essential (primary) hypertension: Secondary | ICD-10-CM | POA: Diagnosis not present

## 2023-11-19 DIAGNOSIS — R9431 Abnormal electrocardiogram [ECG] [EKG]: Secondary | ICD-10-CM | POA: Diagnosis not present

## 2023-11-22 ENCOUNTER — Encounter: Payer: Self-pay | Admitting: Cardiovascular Disease

## 2023-11-23 ENCOUNTER — Ambulatory Visit: Admitting: Cardiovascular Disease

## 2023-11-23 ENCOUNTER — Encounter: Payer: Self-pay | Admitting: Cardiovascular Disease

## 2023-11-23 VITALS — BP 148/68 | HR 94 | Ht 76.0 in | Wt 228.0 lb

## 2023-11-23 DIAGNOSIS — I5032 Chronic diastolic (congestive) heart failure: Secondary | ICD-10-CM

## 2023-11-23 DIAGNOSIS — E782 Mixed hyperlipidemia: Secondary | ICD-10-CM

## 2023-11-23 DIAGNOSIS — F17210 Nicotine dependence, cigarettes, uncomplicated: Secondary | ICD-10-CM | POA: Diagnosis not present

## 2023-11-23 DIAGNOSIS — R9431 Abnormal electrocardiogram [ECG] [EKG]: Secondary | ICD-10-CM | POA: Diagnosis not present

## 2023-11-23 DIAGNOSIS — I10 Essential (primary) hypertension: Secondary | ICD-10-CM

## 2023-11-23 DIAGNOSIS — R0789 Other chest pain: Secondary | ICD-10-CM

## 2023-11-23 DIAGNOSIS — I340 Nonrheumatic mitral (valve) insufficiency: Secondary | ICD-10-CM

## 2023-11-23 DIAGNOSIS — F17200 Nicotine dependence, unspecified, uncomplicated: Secondary | ICD-10-CM

## 2023-11-23 MED ORDER — SPIRONOLACTONE 25 MG PO TABS: 12.5000 mg | ORAL_TABLET | Freq: Every day | ORAL | 1 refills | Status: DC

## 2023-11-23 NOTE — Progress Notes (Signed)
 Cardiology Office Note   Date:  11/23/2023   ID:  Joseph Daugherty, DOB 08-Jul-1961, MRN [id number]  PCP:  Rex Castor, MD  Cardiologist:  Debborah Fairly, MD      History of Present Illness: Joseph Daugherty is a 63 y.o. male who presents for  Chief Complaint  Patient presents with   Follow-up    2 week echo and NST results    Feels lot better as had pneumonia last time.      Past Medical History:  Diagnosis Date   Diabetes mellitus without complication (HCC)    Hyperlipidemia    Hypertension      Past Surgical History:  Procedure Laterality Date   PROSTATE BIOPSY  01/30/2020     Current Outpatient Medications  Medication Sig Dispense Refill   spironolactone (ALDACTONE) 25 MG tablet Take 0.5 tablets (12.5 mg total) by mouth daily. 30 tablet 1   celecoxib (CELEBREX) 200 MG capsule Take 200 mg by mouth daily.     chlorpheniramine-HYDROcodone  (TUSSIONEX) 10-8 MG/5ML Take 5 mLs by mouth every 12 (twelve) hours as needed for cough. 70 mL 0   cyclobenzaprine (FLEXERIL) 5 MG tablet Take by mouth. (Patient not taking: Reported on 10/30/2023)     finasteride  (PROSCAR ) 5 MG tablet Take 1 tablet (5 mg total) by mouth daily. 90 tablet 3   Fluticasone-Salmeterol (ADVAIR) 100-50 MCG/DOSE AEPB Inhale into the lungs.     glipiZIDE (GLUCOTROL XL) 5 MG 24 hr tablet Take 5 mg by mouth daily.     lisinopril-hydrochlorothiazide (ZESTORETIC) 20-12.5 MG tablet Take 1 tablet by mouth daily.     meloxicam (MOBIC) 15 MG tablet Take 15 mg by mouth daily. (Patient not taking: Reported on 10/30/2023)     metFORMIN (GLUCOPHAGE) 1000 MG tablet Take 1,000 mg by mouth 2 (two) times daily.     metoprolol succinate (TOPROL-XL) 25 MG 24 hr tablet Take 25 mg by mouth daily.     rosuvastatin (CRESTOR) 10 MG tablet Take 10 mg by mouth daily.     sildenafil (VIAGRA) 100 MG tablet TAKE (1) TABLET BY MOUTH 1 HOUR PRIOR TO SEXUAL ACTIVITY. ~MAX OF 1 TAB PER DAY~     sitaGLIPtin-metformin (JANUMET) 50-1000 MG  tablet Take 1 tablet by mouth 2 (two) times daily with a meal.     tamsulosin  (FLOMAX ) 0.4 MG CAPS capsule Take 1 capsule (0.4 mg total) by mouth daily. 90 capsule 3   No current facility-administered medications for this visit.    Allergies:   Penicillins    Social History:   reports that he has been smoking cigarettes. He has never used smokeless tobacco. He reports that he does not currently use drugs. He reports that he does not drink alcohol.   Family History:  family history is not on file.    ROS:     Review of Systems  Constitutional: Negative.   HENT: Negative.    Eyes: Negative.   Respiratory: Negative.    Gastrointestinal: Negative.   Genitourinary: Negative.   Musculoskeletal: Negative.   Skin: Negative.   Neurological: Negative.   Endo/Heme/Allergies: Negative.   Psychiatric/Behavioral: Negative.    All other systems reviewed and are negative.     All other systems are reviewed and negative.    PHYSICAL EXAM: VS:  BP (!) 148/68   Pulse 94   Ht 6\' 4"  (1.93 m)   Wt 228 lb (103.4 kg)   SpO2 95%   BMI 27.75 kg/m  , BMI Body mass index  is 27.75 kg/m. Last weight:  Wt Readings from Last 3 Encounters:  11/23/23 228 lb (103.4 kg)  11/07/23 233 lb (105.7 kg)  10/30/23 238 lb (108 kg)     Physical Exam Vitals reviewed.  Constitutional:      Appearance: Normal appearance. He is normal weight.  HENT:     Head: Normocephalic.     Nose: Nose normal.     Mouth/Throat:     Mouth: Mucous membranes are moist.  Eyes:     Pupils: Pupils are equal, round, and reactive to light.  Cardiovascular:     Rate and Rhythm: Normal rate and regular rhythm.     Pulses: Normal pulses.     Heart sounds: Normal heart sounds.  Pulmonary:     Effort: Pulmonary effort is normal.  Abdominal:     General: Abdomen is flat. Bowel sounds are normal.  Musculoskeletal:        General: Normal range of motion.     Cervical back: Normal range of motion.  Skin:    General: Skin  is warm.  Neurological:     General: No focal deficit present.     Mental Status: He is alert.  Psychiatric:        Mood and Affect: Mood normal.       EKG:   Recent Labs: 10/28/2023: ALT 19 11/12/2023: BUN 21; Creatinine, Ser 1.13; Hemoglobin 13.0; Platelets 333; Potassium 3.1; Sodium 138    Lipid Panel No results found for: "CHOL", "TRIG", "HDL", "CHOLHDL", "VLDL", "LDLCALC", "LDLDIRECT"    Other studies Reviewed: Additional studies/ records that were reviewed today include:  Review of the above records demonstrates:       No data to display            ASSESSMENT AND PLAN:    ICD-10-CM   1. Mixed hyperlipidemia  E78.2 spironolactone (ALDACTONE) 25 MG tablet    2. Current smoker  F17.200 spironolactone (ALDACTONE) 25 MG tablet    3. Cigarette nicotine dependence without complication  F17.210 spironolactone (ALDACTONE) 25 MG tablet    4. Abnormal EKG  R94.31 spironolactone (ALDACTONE) 25 MG tablet    5. Hypertension, benign  I10 spironolactone (ALDACTONE) 25 MG tablet    6. Other chest pain  R07.89 spironolactone (ALDACTONE) 25 MG tablet    7. Nonrheumatic mitral valve regurgitation  I34.0 spironolactone (ALDACTONE) 25 MG tablet    8. Chronic diastolic congestive heart failure (HCC)  I50.32 spironolactone (ALDACTONE) 25 MG tablet   LVEF 53%, no sicahemia on stress test. ECHO had grade 1 dastolic dysfunction, advise fjaudiance/aldactone/ace.       Problem List Items Addressed This Visit       Cardiovascular and Mediastinum   Hypertension, benign   Relevant Medications   spironolactone (ALDACTONE) 25 MG tablet     Other   Abnormal EKG   Relevant Medications   spironolactone (ALDACTONE) 25 MG tablet   Current smoker   Relevant Medications   spironolactone (ALDACTONE) 25 MG tablet   HLD (hyperlipidemia) - Primary   Relevant Medications   spironolactone (ALDACTONE) 25 MG tablet   Nicotine dependence   Relevant Medications   spironolactone  (ALDACTONE) 25 MG tablet   Other Visit Diagnoses       Other chest pain       Relevant Medications   spironolactone (ALDACTONE) 25 MG tablet     Nonrheumatic mitral valve regurgitation       Relevant Medications   spironolactone (ALDACTONE) 25 MG tablet  Chronic diastolic congestive heart failure (HCC)       LVEF 53%, no sicahemia on stress test. ECHO had grade 1 dastolic dysfunction, advise fjaudiance/aldactone/ace.   Relevant Medications   spironolactone (ALDACTONE) 25 MG tablet          Disposition:   Return in about 5 weeks (around 12/28/2023).    Total time spent: 409 minutes  Signed,  Debborah Fairly, MD  11/23/2023 2:33 PM    [facility]

## 2023-11-23 NOTE — Patient Instructions (Signed)
 Stop amlodapine, atenolol. Start aldactone 25 mg, 1/2 tab daily, and continue metoprolol 25,jardiance/lisinopril/HTZ

## 2023-12-05 ENCOUNTER — Telehealth: Payer: Self-pay

## 2023-12-05 ENCOUNTER — Ambulatory Visit: Admitting: Student in an Organized Health Care Education/Training Program

## 2023-12-05 ENCOUNTER — Encounter: Payer: Self-pay | Admitting: Student in an Organized Health Care Education/Training Program

## 2023-12-05 VITALS — BP 110/72 | HR 88 | Temp 98.4°F | Ht 76.0 in | Wt 227.2 lb

## 2023-12-05 DIAGNOSIS — R918 Other nonspecific abnormal finding of lung field: Secondary | ICD-10-CM

## 2023-12-05 DIAGNOSIS — F17210 Nicotine dependence, cigarettes, uncomplicated: Secondary | ICD-10-CM | POA: Diagnosis not present

## 2023-12-05 DIAGNOSIS — R911 Solitary pulmonary nodule: Secondary | ICD-10-CM

## 2023-12-05 HISTORY — DX: Other nonspecific abnormal finding of lung field: R91.8

## 2023-12-05 NOTE — Telephone Encounter (Signed)
 For the code 16109 Prior Auth Not Required Refer # [id number] spoke with Veneda Gift

## 2023-12-05 NOTE — Telephone Encounter (Signed)
 Noted. Nothing further needed.

## 2023-12-05 NOTE — Progress Notes (Signed)
 Assessment & Plan:   1. Lung nodule (Primary)  Nodule Location: LUL Nodule Size: 1.9 Nodule Spiculation: No Associated Lymphadenopathy: No Smoking Status (current) and pack years: 40 Extrathoracic cancer > 5 years prior (no) ECOG: 0  The patient is here to discuss their imaging abnormalities which include multiple bilateral pulmonary nodules that presented after recent infectious symptoms throughout April.  I have personally reviewed his most recent chest CT and compared it to the prior low-dose CT scan from November. There is the interval development of multiple pulmonary nodules, largest of which is in the left upper lobe, on the most recent CT.  That left upper lobe nodule is surrounded by halo of groundglass that suggests an infectious etiology (such as fungus of mycobacteria).  I did discuss the findings and my differential diagnoses with Joseph Daugherty.  It is difficult to rule out malignancy without a biopsy though should repeat imaging show regression in the size of the nodules this would be supportive of a benign process.  We will plan for robotic assisted navigational bronchoscopy next week and use the dedicated super D CT scan to reevaluate the nodules.  Should there be stability or growth in the size of the nodules we will proceed with biopsy.  We discussed the importance of diagnosis and staging in lung malignancies, and the approach to obtaining a tissue diagnosis which would include robotic assisted navigational bronchoscopy with endobronchial ultrasound guided sampling.  We also discussed the risks associated with the procedure which include a 2% risk of pneumothorax, infection, bleeding, and nondiagnostic procedure in detail.  I explained that patients typically are able to return home the same day of the procedure, but in rare cases admission to the hospital for observation and treatment is required.  After our discussion, the patient elected to proceed with the  procedure  Recommendations:  - Procedural/ Surgical Case Request: ROBOTIC ASSISTED NAVIGATIONAL BRONCHOSCOPY; Future - CT SUPER D CHEST WO CONTRAST; Future   No follow-ups on file.  I spent 60 minutes caring for this patient today, including preparing to see the patient, obtaining a medical history , reviewing a separately obtained history, performing a medically appropriate examination and/or evaluation, counseling and educating the patient/family/caregiver, ordering medications, tests, or procedures, documenting clinical information in the electronic health record, and independently interpreting results (not separately reported/billed) and communicating results to the patient/family/caregiver  Vergia Glasgow, MD [facility]  End of visit medications:  No orders of the defined types were placed in this encounter.    Current Outpatient Medications:    celecoxib (CELEBREX) 200 MG capsule, Take 200 mg by mouth daily., Disp: , Rfl:    chlorpheniramine-HYDROcodone  (TUSSIONEX) 10-8 MG/5ML, Take 5 mLs by mouth every 12 (twelve) hours as needed for cough., Disp: 70 mL, Rfl: 0   finasteride  (PROSCAR ) 5 MG tablet, Take 1 tablet (5 mg total) by mouth daily., Disp: 90 tablet, Rfl: 3   glipiZIDE (GLUCOTROL XL) 10 MG 24 hr tablet, Take 10 mg by mouth daily with breakfast., Disp: , Rfl:    JARDIANCE 25 MG TABS tablet, Take 25 mg by mouth daily., Disp: , Rfl:    levofloxacin  (LEVAQUIN ) 500 MG tablet, Take 500 mg by mouth daily., Disp: , Rfl:    lisinopril-hydrochlorothiazide (ZESTORETIC) 20-12.5 MG tablet, Take 1 tablet by mouth daily., Disp: , Rfl:    metFORMIN (GLUCOPHAGE) 1000 MG tablet, Take 1,000 mg by mouth 2 (two) times daily., Disp: , Rfl:    metoprolol succinate (TOPROL-XL) 25 MG  24 hr tablet, Take 25 mg by mouth daily., Disp: , Rfl:    pioglitazone (ACTOS) 30 MG tablet, Take 30 mg by mouth daily., Disp: , Rfl:    rosuvastatin (CRESTOR) 20 MG tablet, Take 20 mg by mouth  daily., Disp: , Rfl:    sildenafil (VIAGRA) 100 MG tablet, TAKE (1) TABLET BY MOUTH 1 HOUR PRIOR TO SEXUAL ACTIVITY. ~MAX OF 1 TAB PER DAY~, Disp: , Rfl:    sitaGLIPtin-metformin (JANUMET) 50-1000 MG tablet, Take 1 tablet by mouth 2 (two) times daily with a meal., Disp: , Rfl:    spironolactone  (ALDACTONE ) 25 MG tablet, Take 0.5 tablets (12.5 mg total) by mouth daily., Disp: 30 tablet, Rfl: 1   tamsulosin  (FLOMAX ) 0.4 MG CAPS capsule, Take 1 capsule (0.4 mg total) by mouth daily., Disp: 90 capsule, Rfl: 3   cyclobenzaprine (FLEXERIL) 5 MG tablet, Take by mouth. (Patient not taking: Reported on 12/05/2023), Disp: , Rfl:    Fluticasone-Salmeterol (ADVAIR) 100-50 MCG/DOSE AEPB, Inhale into the lungs. (Patient not taking: Reported on 12/05/2023), Disp: , Rfl:    meloxicam (MOBIC) 15 MG tablet, Take 15 mg by mouth daily. (Patient not taking: Reported on 10/30/2023), Disp: , Rfl:    Subjective:   PATIENT ID: Joseph Daugherty GENDER: male DOB: 1961/04/26, MRN: [id number]  Chief Complaint  Patient presents with   New Patient (Initial Visit)    New: Lung nodule, current smoker, CTA: 11/13/23, x-ray: 11/13/23, CT: 06/14/23  Recently getting over pneumonia, and every morning when he wakes up he has to cough up lime green phlegm that has red streaks in it. Not currently on any inhalers, and has cut back on smoking since his his infection, has some wheezing and very minimal SOB, no DOE.     HPI  Patient is a pleasant 63 year old male presenting to clinic for the evaluation of a pulmonary nodule.  Patient reports symptoms of cough and shortness of breath as well as chest pain throughout April requiring 3 presentations to the emergency department.  He initially presented to the ED on 10/28/2023 with sharp pleuritic chest pain treated symptomatically with a normal chest x-ray.  He then presented again on 11/07/2023 with similar symptoms in addition to a cough.  His cough was productive of sputum work chest x-ray showed  multifocal infiltrates treated with doxycycline .  He again presented back on 11/13/2023 with worsening shortness of breath, cough, and chest pain.  At that time, a CT scan of the chest was performed showing multiple nodular densities in the lung.  Blood work showed mild leukocytosis.  He was prescribed a course of levofloxacin  on 11/14/2023 and referred to pulmonary for further evaluation.  Today, he feels somewhat better but his cough persists.  His cough is productive of sputum with occasional streaks of blood mixed in.  He has not had any fevers or chills since his ED presentations but continues to feel unwell overall.  He has lost some weight and attributes it to being ill and not having an appetite.  Of note, patient had a low-dose CT of the chest in November 2024 for lung cancer screening that was unremarkable without any nodularity.  That CT was notable for emphysema.  Patient works as a Theatre manager.  He denies any occupational exposures and also denies any significant sick contacts.  He is a smoker, having smoked three quarters of a pack for around 40 years.  He probably has 40 pack years of smoking history.  Ancillary information including prior medications, full  medical/surgical/family/social histories, and PFTs (when available) are listed below and have been reviewed.   Review of Systems  Constitutional:  Positive for fever and weight loss. Negative for chills.  Respiratory:  Positive for cough and sputum production. Negative for shortness of breath and wheezing.   Cardiovascular:  Positive for chest pain.     Objective:   Vitals:   12/05/23 1407  BP: 110/72  Pulse: 88  Temp: 98.4 F (36.9 C)  TempSrc: Oral  SpO2: 96%  Weight: 227 lb 3.2 oz (103.1 kg)  Height: 6\' 4"  (1.93 m)   96% on RA  BMI Readings from Last 3 Encounters:  12/05/23 27.66 kg/m  11/23/23 27.75 kg/m  11/07/23 28.36 kg/m   Wt Readings from Last 3 Encounters:  12/05/23 227 lb 3.2 oz (103.1  kg)  11/23/23 228 lb (103.4 kg)  11/07/23 233 lb (105.7 kg)    Physical Exam Constitutional:      Appearance: Normal appearance.  Cardiovascular:     Rate and Rhythm: Normal rate and regular rhythm.     Pulses: Normal pulses.     Heart sounds: Normal heart sounds.  Pulmonary:     Effort: Pulmonary effort is normal.     Breath sounds: Normal breath sounds. No wheezing or rales.  Neurological:     General: No focal deficit present.     Mental Status: He is alert and oriented to person, place, and time. Mental status is at baseline.       Ancillary Information    Past Medical History:  Diagnosis Date   Diabetes mellitus without complication (HCC)    Hyperlipidemia    Hypertension      History reviewed. No pertinent family history.   Past Surgical History:  Procedure Laterality Date   PROSTATE BIOPSY  01/30/2020    Social History   Socioeconomic History   Marital status: Single    Spouse name: Not on file   Number of children: Not on file   Years of education: Not on file   Highest education level: Not on file  Occupational History   Not on file  Tobacco Use   Smoking status: Every Day    Current packs/day: 0.25    Average packs/day: 1 pack/day for 43.8 years (43.8 ttl pk-yrs)    Types: Cigarettes    Start date: 02/05/1980   Smokeless tobacco: Never   Tobacco comments:    Smoking about 6 cigarettes a day now, at his heaviest 1-1.5 PPD-- 12/05/23  Substance and Sexual Activity   Alcohol use: Never   Drug use: Not Currently   Sexual activity: Yes    Birth control/protection: None  Other Topics Concern   Not on file  Social History Narrative   Not on file   Social Drivers of Health   Financial Resource Strain: Low Risk  (11/07/2023)   Received from [facility]   Overall Financial Resource Strain (CARDIA)    Difficulty of Paying Living Expenses: Not hard at all  Food Insecurity: No Food Insecurity (11/07/2023)   Received from [facility]   Hunger Vital Sign    Worried About Running Out of Food in the Last Year: Never true    Ran Out of Food in the Last Year: Never true  Transportation Needs: No Transportation Needs (11/07/2023)   Received from [facility] - Transportation    In the past 12 months, has lack of transportation kept you from medical appointments  or from getting medications?: No    Lack of Transportation (Non-Medical): No  Physical Activity: Not on File (02/26/2019)   Received from [place], [place]   Physical Activity    Physical Activity: 0  Stress: Not on File (02/26/2019)   Received from [place], [place]   Stress    Stress: 0  Social Connections: Not on File (04/01/2023)   Received from [facility]   Social Connections    Connectedness: 0  Intimate Partner Violence: Not on file     Allergies  Allergen Reactions   Penicillins Hives and Anaphylaxis     CBC    Component Value Date/Time   WBC 10.7 (H) 11/12/2023 2232   RBC 4.50 11/12/2023 2232   HGB 13.0 11/12/2023 2232   HCT 39.1 11/12/2023 2232   PLT 333 11/12/2023 2232   MCV 86.9 11/12/2023 2232   MCH 28.9 11/12/2023 2232   MCHC 33.2 11/12/2023 2232   RDW 13.1 11/12/2023 2232   LYMPHSABS 3.3 08/17/2022 0836   MONOABS 1.0 08/17/2022 0836   EOSABS 0.2 08/17/2022 0836   BASOSABS 0.0 08/17/2022 0836    Pulmonary Functions Testing Results:     No data to display          Outpatient Medications Prior to Visit  Medication Sig Dispense Refill   celecoxib (CELEBREX) 200 MG capsule Take 200 mg by mouth daily.     chlorpheniramine-HYDROcodone  (TUSSIONEX) 10-8 MG/5ML Take 5 mLs by mouth every 12 (twelve) hours as needed for cough. 70 mL 0   finasteride  (PROSCAR ) 5 MG tablet Take 1 tablet (5 mg total) by mouth daily. 90 tablet 3   glipiZIDE (GLUCOTROL XL) 10 MG 24 hr tablet Take 10 mg by mouth daily with breakfast.     JARDIANCE 25 MG TABS tablet Take 25 mg by mouth daily.     levofloxacin  (LEVAQUIN ) 500  MG tablet Take 500 mg by mouth daily.     lisinopril-hydrochlorothiazide (ZESTORETIC) 20-12.5 MG tablet Take 1 tablet by mouth daily.     metFORMIN (GLUCOPHAGE) 1000 MG tablet Take 1,000 mg by mouth 2 (two) times daily.     metoprolol succinate (TOPROL-XL) 25 MG 24 hr tablet Take 25 mg by mouth daily.     pioglitazone (ACTOS) 30 MG tablet Take 30 mg by mouth daily.     rosuvastatin (CRESTOR) 20 MG tablet Take 20 mg by mouth daily.     sildenafil (VIAGRA) 100 MG tablet TAKE (1) TABLET BY MOUTH 1 HOUR PRIOR TO SEXUAL ACTIVITY. ~MAX OF 1 TAB PER DAY~     sitaGLIPtin-metformin (JANUMET) 50-1000 MG tablet Take 1 tablet by mouth 2 (two) times daily with a meal.     spironolactone  (ALDACTONE ) 25 MG tablet Take 0.5 tablets (12.5 mg total) by mouth daily. 30 tablet 1   tamsulosin  (FLOMAX ) 0.4 MG CAPS capsule Take 1 capsule (0.4 mg total) by mouth daily. 90 capsule 3   cyclobenzaprine (FLEXERIL) 5 MG tablet Take by mouth. (Patient not taking: Reported on 12/05/2023)     Fluticasone-Salmeterol (ADVAIR) 100-50 MCG/DOSE AEPB Inhale into the lungs. (Patient not taking: Reported on 12/05/2023)     meloxicam (MOBIC) 15 MG tablet Take 15 mg by mouth daily. (Patient not taking: Reported on 10/30/2023)     glipiZIDE (GLUCOTROL XL) 5 MG 24 hr tablet Take 5 mg by mouth daily.     rosuvastatin (CRESTOR) 10 MG tablet Take 10 mg by mouth daily.     No facility-administered medications prior to visit.

## 2023-12-05 NOTE — H&P (View-Only) (Signed)
 Assessment & Plan:   1. Lung nodule (Primary)  Nodule Location: LUL Nodule Size: 1.9 Nodule Spiculation: No Associated Lymphadenopathy: No Smoking Status (current) and pack years: 40 Extrathoracic cancer > 5 years prior (no) ECOG: 0  The patient is here to discuss their imaging abnormalities which include multiple bilateral pulmonary nodules that presented after recent infectious symptoms throughout April.  I have personally reviewed his most recent chest CT and compared it to the prior low-dose CT scan from November. There is the interval development of multiple pulmonary nodules, largest of which is in the left upper lobe, on the most recent CT.  That left upper lobe nodule is surrounded by halo of groundglass that suggests an infectious etiology (such as fungus of mycobacteria).  I did discuss the findings and my differential diagnoses with Joseph Daugherty.  It is difficult to rule out malignancy without a biopsy though should repeat imaging show regression in the size of the nodules this would be supportive of a benign process.  We will plan for robotic assisted navigational bronchoscopy next week and use the dedicated super D CT scan to reevaluate the nodules.  Should there be stability or growth in the size of the nodules we will proceed with biopsy.  We discussed the importance of diagnosis and staging in lung malignancies, and the approach to obtaining a tissue diagnosis which would include robotic assisted navigational bronchoscopy with endobronchial ultrasound guided sampling.  We also discussed the risks associated with the procedure which include a 2% risk of pneumothorax, infection, bleeding, and nondiagnostic procedure in detail.  I explained that patients typically are able to return home the same day of the procedure, but in rare cases admission to the hospital for observation and treatment is required.  After our discussion, the patient elected to proceed with the  procedure  Recommendations:  - Procedural/ Surgical Case Request: ROBOTIC ASSISTED NAVIGATIONAL BRONCHOSCOPY; Future - CT SUPER D CHEST WO CONTRAST; Future   No follow-ups on file.  I spent 60 minutes caring for this patient today, including preparing to see the patient, obtaining a medical history , reviewing a separately obtained history, performing a medically appropriate examination and/or evaluation, counseling and educating the patient/family/caregiver, ordering medications, tests, or procedures, documenting clinical information in the electronic health record, and independently interpreting results (not separately reported/billed) and communicating results to the patient/family/caregiver  Vergia Glasgow, MD [facility]  End of visit medications:  No orders of the defined types were placed in this encounter.    Current Outpatient Medications:    celecoxib (CELEBREX) 200 MG capsule, Take 200 mg by mouth daily., Disp: , Rfl:    chlorpheniramine-HYDROcodone  (TUSSIONEX) 10-8 MG/5ML, Take 5 mLs by mouth every 12 (twelve) hours as needed for cough., Disp: 70 mL, Rfl: 0   finasteride  (PROSCAR ) 5 MG tablet, Take 1 tablet (5 mg total) by mouth daily., Disp: 90 tablet, Rfl: 3   glipiZIDE (GLUCOTROL XL) 10 MG 24 hr tablet, Take 10 mg by mouth daily with breakfast., Disp: , Rfl:    JARDIANCE 25 MG TABS tablet, Take 25 mg by mouth daily., Disp: , Rfl:    levofloxacin  (LEVAQUIN ) 500 MG tablet, Take 500 mg by mouth daily., Disp: , Rfl:    lisinopril-hydrochlorothiazide (ZESTORETIC) 20-12.5 MG tablet, Take 1 tablet by mouth daily., Disp: , Rfl:    metFORMIN (GLUCOPHAGE) 1000 MG tablet, Take 1,000 mg by mouth 2 (two) times daily., Disp: , Rfl:    metoprolol succinate (TOPROL-XL) 25 MG  24 hr tablet, Take 25 mg by mouth daily., Disp: , Rfl:    pioglitazone (ACTOS) 30 MG tablet, Take 30 mg by mouth daily., Disp: , Rfl:    rosuvastatin (CRESTOR) 20 MG tablet, Take 20 mg by mouth  daily., Disp: , Rfl:    sildenafil (VIAGRA) 100 MG tablet, TAKE (1) TABLET BY MOUTH 1 HOUR PRIOR TO SEXUAL ACTIVITY. ~MAX OF 1 TAB PER DAY~, Disp: , Rfl:    sitaGLIPtin-metformin (JANUMET) 50-1000 MG tablet, Take 1 tablet by mouth 2 (two) times daily with a meal., Disp: , Rfl:    spironolactone  (ALDACTONE ) 25 MG tablet, Take 0.5 tablets (12.5 mg total) by mouth daily., Disp: 30 tablet, Rfl: 1   tamsulosin  (FLOMAX ) 0.4 MG CAPS capsule, Take 1 capsule (0.4 mg total) by mouth daily., Disp: 90 capsule, Rfl: 3   cyclobenzaprine (FLEXERIL) 5 MG tablet, Take by mouth. (Patient not taking: Reported on 12/05/2023), Disp: , Rfl:    Fluticasone-Salmeterol (ADVAIR) 100-50 MCG/DOSE AEPB, Inhale into the lungs. (Patient not taking: Reported on 12/05/2023), Disp: , Rfl:    meloxicam (MOBIC) 15 MG tablet, Take 15 mg by mouth daily. (Patient not taking: Reported on 10/30/2023), Disp: , Rfl:    Subjective:   PATIENT ID: Joseph Daugherty GENDER: male DOB: 1961/04/26, MRN: [id number]  Chief Complaint  Patient presents with   New Patient (Initial Visit)    New: Lung nodule, current smoker, CTA: 11/13/23, x-ray: 11/13/23, CT: 06/14/23  Recently getting over pneumonia, and every morning when he wakes up he has to cough up lime green phlegm that has red streaks in it. Not currently on any inhalers, and has cut back on smoking since his his infection, has some wheezing and very minimal SOB, no DOE.     HPI  Patient is a pleasant 63 year old male presenting to clinic for the evaluation of a pulmonary nodule.  Patient reports symptoms of cough and shortness of breath as well as chest pain throughout April requiring 3 presentations to the emergency department.  He initially presented to the ED on 10/28/2023 with sharp pleuritic chest pain treated symptomatically with a normal chest x-ray.  He then presented again on 11/07/2023 with similar symptoms in addition to a cough.  His cough was productive of sputum work chest x-ray showed  multifocal infiltrates treated with doxycycline .  He again presented back on 11/13/2023 with worsening shortness of breath, cough, and chest pain.  At that time, a CT scan of the chest was performed showing multiple nodular densities in the lung.  Blood work showed mild leukocytosis.  He was prescribed a course of levofloxacin  on 11/14/2023 and referred to pulmonary for further evaluation.  Today, he feels somewhat better but his cough persists.  His cough is productive of sputum with occasional streaks of blood mixed in.  He has not had any fevers or chills since his ED presentations but continues to feel unwell overall.  He has lost some weight and attributes it to being ill and not having an appetite.  Of note, patient had a low-dose CT of the chest in November 2024 for lung cancer screening that was unremarkable without any nodularity.  That CT was notable for emphysema.  Patient works as a Theatre manager.  He denies any occupational exposures and also denies any significant sick contacts.  He is a smoker, having smoked three quarters of a pack for around 40 years.  He probably has 40 pack years of smoking history.  Ancillary information including prior medications, full  medical/surgical/family/social histories, and PFTs (when available) are listed below and have been reviewed.   Review of Systems  Constitutional:  Positive for fever and weight loss. Negative for chills.  Respiratory:  Positive for cough and sputum production. Negative for shortness of breath and wheezing.   Cardiovascular:  Positive for chest pain.     Objective:   Vitals:   12/05/23 1407  BP: 110/72  Pulse: 88  Temp: 98.4 F (36.9 C)  TempSrc: Oral  SpO2: 96%  Weight: 227 lb 3.2 oz (103.1 kg)  Height: 6\' 4"  (1.93 m)   96% on RA  BMI Readings from Last 3 Encounters:  12/05/23 27.66 kg/m  11/23/23 27.75 kg/m  11/07/23 28.36 kg/m   Wt Readings from Last 3 Encounters:  12/05/23 227 lb 3.2 oz (103.1  kg)  11/23/23 228 lb (103.4 kg)  11/07/23 233 lb (105.7 kg)    Physical Exam Constitutional:      Appearance: Normal appearance.  Cardiovascular:     Rate and Rhythm: Normal rate and regular rhythm.     Pulses: Normal pulses.     Heart sounds: Normal heart sounds.  Pulmonary:     Effort: Pulmonary effort is normal.     Breath sounds: Normal breath sounds. No wheezing or rales.  Neurological:     General: No focal deficit present.     Mental Status: He is alert and oriented to person, place, and time. Mental status is at baseline.       Ancillary Information    Past Medical History:  Diagnosis Date   Diabetes mellitus without complication (HCC)    Hyperlipidemia    Hypertension      History reviewed. No pertinent family history.   Past Surgical History:  Procedure Laterality Date   PROSTATE BIOPSY  01/30/2020    Social History   Socioeconomic History   Marital status: Single    Spouse name: Not on file   Number of children: Not on file   Years of education: Not on file   Highest education level: Not on file  Occupational History   Not on file  Tobacco Use   Smoking status: Every Day    Current packs/day: 0.25    Average packs/day: 1 pack/day for 43.8 years (43.8 ttl pk-yrs)    Types: Cigarettes    Start date: 02/05/1980   Smokeless tobacco: Never   Tobacco comments:    Smoking about 6 cigarettes a day now, at his heaviest 1-1.5 PPD-- 12/05/23  Substance and Sexual Activity   Alcohol use: Never   Drug use: Not Currently   Sexual activity: Yes    Birth control/protection: None  Other Topics Concern   Not on file  Social History Narrative   Not on file   Social Drivers of Health   Financial Resource Strain: Low Risk  (11/07/2023)   Received from [facility]   Overall Financial Resource Strain (CARDIA)    Difficulty of Paying Living Expenses: Not hard at all  Food Insecurity: No Food Insecurity (11/07/2023)   Received from [facility]   Hunger Vital Sign    Worried About Running Out of Food in the Last Year: Never true    Ran Out of Food in the Last Year: Never true  Transportation Needs: No Transportation Needs (11/07/2023)   Received from [facility] - Transportation    In the past 12 months, has lack of transportation kept you from medical appointments  or from getting medications?: No    Lack of Transportation (Non-Medical): No  Physical Activity: Not on File (02/26/2019)   Received from [place], [place]   Physical Activity    Physical Activity: 0  Stress: Not on File (02/26/2019)   Received from [place], [place]   Stress    Stress: 0  Social Connections: Not on File (04/01/2023)   Received from [facility]   Social Connections    Connectedness: 0  Intimate Partner Violence: Not on file     Allergies  Allergen Reactions   Penicillins Hives and Anaphylaxis     CBC    Component Value Date/Time   WBC 10.7 (H) 11/12/2023 2232   RBC 4.50 11/12/2023 2232   HGB 13.0 11/12/2023 2232   HCT 39.1 11/12/2023 2232   PLT 333 11/12/2023 2232   MCV 86.9 11/12/2023 2232   MCH 28.9 11/12/2023 2232   MCHC 33.2 11/12/2023 2232   RDW 13.1 11/12/2023 2232   LYMPHSABS 3.3 08/17/2022 0836   MONOABS 1.0 08/17/2022 0836   EOSABS 0.2 08/17/2022 0836   BASOSABS 0.0 08/17/2022 0836    Pulmonary Functions Testing Results:     No data to display          Outpatient Medications Prior to Visit  Medication Sig Dispense Refill   celecoxib (CELEBREX) 200 MG capsule Take 200 mg by mouth daily.     chlorpheniramine-HYDROcodone  (TUSSIONEX) 10-8 MG/5ML Take 5 mLs by mouth every 12 (twelve) hours as needed for cough. 70 mL 0   finasteride  (PROSCAR ) 5 MG tablet Take 1 tablet (5 mg total) by mouth daily. 90 tablet 3   glipiZIDE (GLUCOTROL XL) 10 MG 24 hr tablet Take 10 mg by mouth daily with breakfast.     JARDIANCE 25 MG TABS tablet Take 25 mg by mouth daily.     levofloxacin  (LEVAQUIN ) 500  MG tablet Take 500 mg by mouth daily.     lisinopril-hydrochlorothiazide (ZESTORETIC) 20-12.5 MG tablet Take 1 tablet by mouth daily.     metFORMIN (GLUCOPHAGE) 1000 MG tablet Take 1,000 mg by mouth 2 (two) times daily.     metoprolol succinate (TOPROL-XL) 25 MG 24 hr tablet Take 25 mg by mouth daily.     pioglitazone (ACTOS) 30 MG tablet Take 30 mg by mouth daily.     rosuvastatin (CRESTOR) 20 MG tablet Take 20 mg by mouth daily.     sildenafil (VIAGRA) 100 MG tablet TAKE (1) TABLET BY MOUTH 1 HOUR PRIOR TO SEXUAL ACTIVITY. ~MAX OF 1 TAB PER DAY~     sitaGLIPtin-metformin (JANUMET) 50-1000 MG tablet Take 1 tablet by mouth 2 (two) times daily with a meal.     spironolactone  (ALDACTONE ) 25 MG tablet Take 0.5 tablets (12.5 mg total) by mouth daily. 30 tablet 1   tamsulosin  (FLOMAX ) 0.4 MG CAPS capsule Take 1 capsule (0.4 mg total) by mouth daily. 90 capsule 3   cyclobenzaprine (FLEXERIL) 5 MG tablet Take by mouth. (Patient not taking: Reported on 12/05/2023)     Fluticasone-Salmeterol (ADVAIR) 100-50 MCG/DOSE AEPB Inhale into the lungs. (Patient not taking: Reported on 12/05/2023)     meloxicam (MOBIC) 15 MG tablet Take 15 mg by mouth daily. (Patient not taking: Reported on 10/30/2023)     glipiZIDE (GLUCOTROL XL) 5 MG 24 hr tablet Take 5 mg by mouth daily.     rosuvastatin (CRESTOR) 10 MG tablet Take 10 mg by mouth daily.     No facility-administered medications prior to visit.

## 2023-12-05 NOTE — Telephone Encounter (Signed)
 Robotic Bronchoscopy  12/11/2023 at 12:00pm Lung Nodule [id number]  Joseph Daugherty please see bronch info.  Patient is aware of date and time. Bronch email has been sent.

## 2023-12-10 ENCOUNTER — Ambulatory Visit
Admission: RE | Admit: 2023-12-10 | Discharge: 2023-12-10 | Disposition: A | Discharge status: Home / Self Care | Source: Ambulatory Visit | Attending: Student in an Organized Health Care Education/Training Program | Admitting: Student in an Organized Health Care Education/Training Program

## 2023-12-10 ENCOUNTER — Encounter
Admission: RE | Admit: 2023-12-10 | Discharge: 2023-12-10 | Disposition: A | Discharge status: Home / Self Care | Source: Ambulatory Visit | Attending: Student in an Organized Health Care Education/Training Program | Admitting: Student in an Organized Health Care Education/Training Program

## 2023-12-10 ENCOUNTER — Other Ambulatory Visit: Payer: Self-pay

## 2023-12-10 VITALS — Ht 76.0 in | Wt 230.0 lb

## 2023-12-10 DIAGNOSIS — R911 Solitary pulmonary nodule: Secondary | ICD-10-CM | POA: Insufficient documentation

## 2023-12-10 DIAGNOSIS — I10 Essential (primary) hypertension: Secondary | ICD-10-CM

## 2023-12-10 DIAGNOSIS — E119 Type 2 diabetes mellitus without complications: Secondary | ICD-10-CM

## 2023-12-10 DIAGNOSIS — Z01812 Encounter for preprocedural laboratory examination: Secondary | ICD-10-CM

## 2023-12-10 HISTORY — DX: Chronic obstructive pulmonary disease, unspecified: J44.9

## 2023-12-10 NOTE — Patient Instructions (Addendum)
 Your procedure is scheduled on:  TUESDAY MAY 20  Report to the Registration Desk on the 1st floor of the [facility]. To find out your arrival time, please call [phone] between 1PM - 3PM on:  MONDAY MAY 19  If your arrival time is 6:00 am, do not arrive before that time as the [facility] entrance doors do not open until 6:00 am.  REMEMBER: Instructions that are not followed completely may result in serious medical risk, up to and including death; or upon the discretion of your surgeon and anesthesiologist your surgery may need to be rescheduled.  Do not eat food after midnight the night before surgery.  No gum chewing or hard candies.  You may however, drink CLEAR liquids up to 2 hours before you are scheduled to arrive for your surgery. Do not drink anything within 2 hours of your scheduled arrival time.  Clear liquids include: - water  - apple juice without pulp - gatorade (not RED colors) - black coffee or tea (Do NOT add milk or creamers to the coffee or tea) Do NOT drink anything that is not on this list.   One week prior to surgery: Stop Anti-inflammatories (NSAIDS) such as Advil , Aleve , Ibuprofen , Motrin , Naproxen , Naprosyn  and Aspirin  based products such as Excedrin, Goody's Powder, BC Powder. Stop ANY OVER THE COUNTER supplements until after surgery.  You may however, continue to take Tylenol  if needed for pain up until the day of surgery. sildenafil (VIAGRA) hold 2 days prior to surgery, last dose SATURDAY MAY 17  **Follow guidelines for insulin and diabetes medications.** metFORMIN (GLUCOPHAGE) hold 2 days prior to surgery, last dose Tuesday MAY 17  pioglitazone (ACTOS)  hold the day of surgery glipiZIDE (GLUCOTROL XL) hold the morning of surgery JARDIANCE hold three days prior to surgery, last dose FRIDAY MAY 16  sitaGLIPtin-metformin (JANUMET) hold 2 days prior to surgery    Continue taking all of your other prescription medications up until the day of  surgery.  ON THE DAY OF SURGERY ONLY TAKE THESE MEDICATIONS WITH SIPS OF WATER:  celecoxib (CELEBREX)  levofloxacin  (LEVAQUIN )  metoprolol succinate (TOPROL-XL)   No Alcohol for 24 hours before or after surgery.  No Smoking including e-cigarettes for 24 hours before surgery.  No chewable tobacco products for at least 6 hours before surgery.  No nicotine patches on the day of surgery.  Do not use any "recreational" drugs for at least a week (preferably 2 weeks) before your surgery.  Please be advised that the combination of cocaine and anesthesia may have negative outcomes, up to and including death. If you test positive for cocaine, your surgery will be cancelled.  On the morning of surgery brush your teeth with toothpaste and water, you may rinse your mouth with mouthwash if you wish. Do not swallow any toothpaste or mouthwash.  Do not wear jewelry, make-up, hairpins, clips or nail polish.  For welded (permanent) jewelry: bracelets, anklets, waist bands, etc.  Please have this removed prior to surgery.  If it is not removed, there is a chance that hospital personnel will need to cut it off on the day of surgery.  Do not wear lotions, powders, or perfumes.   Do not shave body hair from the neck down 48 hours before surgery.  Contact lenses, hearing aids and dentures may not be worn into surgery.  Do not bring valuables to the hospital. [facility] is not responsible for any missing/lost belongings or valuables.   Notify your doctor  if there is any change in your medical condition (cold, fever, infection).  Wear comfortable clothing (specific to your surgery type) to the hospital.  After surgery, you can help prevent lung complications by doing breathing exercises.  Take deep breaths and cough every 1-2 hours.  If you are being discharged the day of surgery, you will not be allowed to drive home. You will need a responsible individual to drive you home and stay with you for 24  hours after surgery.   If you are taking public transportation, you will need to have a responsible individual with you.  Please call the Pre-admissions Testing Dept. at [phone] if you have any questions about these instructions.  Surgery Visitation Policy:  Patients having surgery or a procedure may have two visitors.  Children under the age of 40 must have an adult with them who is not the patient.

## 2023-12-11 ENCOUNTER — Ambulatory Visit

## 2023-12-11 ENCOUNTER — Other Ambulatory Visit: Payer: Self-pay

## 2023-12-11 ENCOUNTER — Encounter
Admission: RE | Disposition: A | Discharge status: Home / Self Care | Payer: Self-pay | Source: Home / Self Care | Attending: Student in an Organized Health Care Education/Training Program

## 2023-12-11 ENCOUNTER — Ambulatory Visit: Admitting: Certified Registered"

## 2023-12-11 ENCOUNTER — Encounter: Payer: Self-pay | Admitting: Student in an Organized Health Care Education/Training Program

## 2023-12-11 ENCOUNTER — Ambulatory Visit
Admission: RE | Admit: 2023-12-11 | Discharge: 2023-12-11 | Disposition: A | Discharge status: Home / Self Care | Attending: Student in an Organized Health Care Education/Training Program | Admitting: Student in an Organized Health Care Education/Training Program

## 2023-12-11 DIAGNOSIS — R918 Other nonspecific abnormal finding of lung field: Secondary | ICD-10-CM | POA: Diagnosis not present

## 2023-12-11 DIAGNOSIS — Z01812 Encounter for preprocedural laboratory examination: Secondary | ICD-10-CM

## 2023-12-11 DIAGNOSIS — R911 Solitary pulmonary nodule: Secondary | ICD-10-CM

## 2023-12-11 DIAGNOSIS — I10 Essential (primary) hypertension: Secondary | ICD-10-CM | POA: Diagnosis not present

## 2023-12-11 DIAGNOSIS — F17200 Nicotine dependence, unspecified, uncomplicated: Secondary | ICD-10-CM | POA: Insufficient documentation

## 2023-12-11 DIAGNOSIS — J449 Chronic obstructive pulmonary disease, unspecified: Secondary | ICD-10-CM | POA: Diagnosis not present

## 2023-12-11 DIAGNOSIS — E119 Type 2 diabetes mellitus without complications: Secondary | ICD-10-CM | POA: Insufficient documentation

## 2023-12-11 HISTORY — PX: BRONCHOSCOPY, WITH BIOPSY USING ELECTROMAGNETIC NAVIGATION: SHX7536

## 2023-12-11 LAB — BASIC METABOLIC PANEL WITH GFR
Anion gap: 8 (ref 5–15)
BUN: 17 mg/dL (ref 8–23)
CO2: 26 mmol/L (ref 22–32)
Calcium: 8.9 mg/dL (ref 8.9–10.3)
Chloride: 100 mmol/L (ref 98–111)
Creatinine, Ser: 0.9 mg/dL (ref 0.61–1.24)
GFR, Estimated: >60 mL/min (ref >60)
Glucose, Bld: 124 mg/dL — ABNORMAL HIGH (ref 70–99)
Potassium: 4.1 mmol/L (ref 3.5–5.1)
Sodium: 134 mmol/L — ABNORMAL LOW (ref 135–145)

## 2023-12-11 LAB — GLUCOSE, CAPILLARY
Glucose-Capillary: 125 mg/dL — ABNORMAL HIGH (ref 70–99)
Glucose-Capillary: 120 mg/dL — ABNORMAL HIGH (ref 70–99)

## 2023-12-11 SURGERY — BRONCHOSCOPY, WITH BIOPSY USING ELECTROMAGNETIC NAVIGATION
Anesthesia: General | Laterality: Bilateral

## 2023-12-11 MED ORDER — ROCURONIUM BROMIDE 100 MG/10ML IV SOLN
INTRAVENOUS | Status: DC | PRN
  Administered 2023-12-11: 70 mg via INTRAVENOUS
  Administered 2023-12-11: 10 mg via INTRAVENOUS
  Administered 2023-12-11: 20 mg via INTRAVENOUS

## 2023-12-11 MED ORDER — SODIUM CHLORIDE 0.9 % IV SOLN: INTRAVENOUS | Status: DC

## 2023-12-11 MED ORDER — OXYCODONE HCL 5 MG PO TABS: 5.0000 mg | ORAL_TABLET | Freq: Once | ORAL | Status: DC | PRN

## 2023-12-11 MED ORDER — DEXAMETHASONE SODIUM PHOSPHATE 10 MG/ML IJ SOLN
INTRAMUSCULAR | Status: AC
  Filled 2023-12-11: qty 1

## 2023-12-11 MED ORDER — ONDANSETRON HCL 4 MG/2ML IJ SOLN
INTRAMUSCULAR | Status: DC | PRN
  Administered 2023-12-11: 4 mg via INTRAVENOUS

## 2023-12-11 MED ORDER — LIDOCAINE HCL (PF) 2 % IJ SOLN
INTRAMUSCULAR | Status: AC
  Filled 2023-12-11: qty 5

## 2023-12-11 MED ORDER — ROCURONIUM BROMIDE 10 MG/ML (PF) SYRINGE
PREFILLED_SYRINGE | INTRAVENOUS | Status: AC
  Filled 2023-12-11: qty 10

## 2023-12-11 MED ORDER — FENTANYL CITRATE (PF) 100 MCG/2ML IJ SOLN: 25.0000 ug | INTRAMUSCULAR | Status: DC | PRN

## 2023-12-11 MED ORDER — FENTANYL CITRATE (PF) 100 MCG/2ML IJ SOLN
INTRAMUSCULAR | Status: AC
  Filled 2023-12-11: qty 2

## 2023-12-11 MED ORDER — FENTANYL CITRATE (PF) 100 MCG/2ML IJ SOLN
INTRAMUSCULAR | Status: DC | PRN
  Administered 2023-12-11: 100 ug via INTRAVENOUS

## 2023-12-11 MED ORDER — CHLORHEXIDINE GLUCONATE 0.12 % MT SOLN
OROMUCOSAL | Status: AC
  Filled 2023-12-11: qty 15

## 2023-12-11 MED ORDER — LIDOCAINE HCL (CARDIAC) PF 100 MG/5ML IV SOSY
PREFILLED_SYRINGE | INTRAVENOUS | Status: DC | PRN
  Administered 2023-12-11: 100 mg via INTRAVENOUS

## 2023-12-11 MED ORDER — MIDAZOLAM HCL 2 MG/2ML IJ SOLN
INTRAMUSCULAR | Status: DC | PRN
  Administered 2023-12-11: 2 mg via INTRAVENOUS

## 2023-12-11 MED ORDER — DROPERIDOL 2.5 MG/ML IJ SOLN: 0.6250 mg | Freq: Once | INTRAMUSCULAR | Status: DC | PRN

## 2023-12-11 MED ORDER — SUGAMMADEX SODIUM 200 MG/2ML IV SOLN
INTRAVENOUS | Status: DC | PRN
  Administered 2023-12-11: 200 mg via INTRAVENOUS

## 2023-12-11 MED ORDER — ONDANSETRON HCL 4 MG/2ML IJ SOLN
INTRAMUSCULAR | Status: AC
  Filled 2023-12-11: qty 2

## 2023-12-11 MED ORDER — ORAL CARE MOUTH RINSE: 15.0000 mL | Freq: Once | OROMUCOSAL | Status: AC

## 2023-12-11 MED ORDER — PROPOFOL 1000 MG/100ML IV EMUL
INTRAVENOUS | Status: AC
  Filled 2023-12-11: qty 100

## 2023-12-11 MED ORDER — MIDAZOLAM HCL 2 MG/2ML IJ SOLN
INTRAMUSCULAR | Status: AC
  Filled 2023-12-11: qty 2

## 2023-12-11 MED ORDER — CHLORHEXIDINE GLUCONATE 0.12 % MT SOLN
15.0000 mL | Freq: Once | OROMUCOSAL | Status: AC
  Administered 2023-12-11: 15 mL via OROMUCOSAL

## 2023-12-11 MED ORDER — OXYCODONE HCL 5 MG/5ML PO SOLN: 5.0000 mg | Freq: Once | ORAL | Status: DC | PRN

## 2023-12-11 MED ORDER — ACETAMINOPHEN 10 MG/ML IV SOLN: 1000.0000 mg | Freq: Once | INTRAVENOUS | Status: DC | PRN

## 2023-12-11 MED ORDER — PROPOFOL 10 MG/ML IV BOLUS
INTRAVENOUS | Status: DC | PRN
  Administered 2023-12-11: 150 mg via INTRAVENOUS
  Administered 2023-12-11: 125 ug/kg/min via INTRAVENOUS

## 2023-12-11 MED ORDER — DEXAMETHASONE SODIUM PHOSPHATE 10 MG/ML IJ SOLN
INTRAMUSCULAR | Status: DC | PRN
  Administered 2023-12-11: 10 mg via INTRAVENOUS

## 2023-12-11 NOTE — Transfer of Care (Signed)
 Immediate Anesthesia Transfer of Care Note  Patient: Joseph Daugherty  Procedure(s) Performed: ROBOTIC ASSISTED NAVIGATIONAL BRONCHOSCOPY (Bilateral)  Patient Location: PACU  Anesthesia Type:General  Level of Consciousness: drowsy and patient cooperative  Airway & Oxygen Therapy: Patient Spontanous Breathing and Patient connected to face mask oxygen  Post-op Assessment: Report given to RN, Post -op Vital signs reviewed and stable, and Patient moving all extremities  Post vital signs: Reviewed and stable  Last Vitals:  Vitals Value Taken Time  BP 163/106 12/11/23 1345  Temp    Pulse 93 12/11/23 1347  Resp 17 12/11/23 1347  SpO2 100 % 12/11/23 1347  Vitals shown include unfiled device data.  Last Pain:  Vitals:   12/11/23 1109  TempSrc: Temporal  PainSc: 0-No pain         Complications: No notable events documented.

## 2023-12-11 NOTE — Anesthesia Preprocedure Evaluation (Signed)
 Anesthesia Evaluation  Patient identified by MRN, date of birth, ID band Patient awake    Reviewed: Allergy & Precautions, H&P , NPO status , Patient's Chart, lab work & pertinent test results, reviewed documented beta blocker date and time   Airway Mallampati: II  TM Distance: >3 FB Neck ROM: full    Dental  (+) Teeth Intact, Poor Dentition, Chipped, Missing   Pulmonary pneumonia, COPD, Current SmokerPatient did not abstain from smoking. Preop roductive cough present with blood tinged sputum reported. ja   Pulmonary exam normal        Cardiovascular Exercise Tolerance: Poor hypertension, On Medications negative cardio ROS Normal cardiovascular exam Rhythm:regular Rate:Normal     Neuro/Psych  PSYCHIATRIC DISORDERS      negative neurological ROS     GI/Hepatic negative GI ROS, Neg liver ROS,,,  Endo/Other  negative endocrine ROSdiabetes, Well Controlled    Renal/GU negative Renal ROS  negative genitourinary   Musculoskeletal   Abdominal   Peds  Hematology negative hematology ROS (+)   Anesthesia Other Findings Past Medical History: 10/21/2018: BPH (benign prostatic hyperplasia) No date: COPD (chronic obstructive pulmonary disease) (HCC) No date: Diabetes mellitus without complication (HCC) No date: Hyperlipidemia No date: Hypertension 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 12/05/2023: Lung nodules Past Surgical History: 01/30/2020: PROSTATE BIOPSY BMI    Body Mass Index: 28.00 kg/m     Reproductive/Obstetrics negative OB ROS                             Anesthesia Physical Anesthesia Plan  ASA: 3  Anesthesia Plan: General ETT   Post-op Pain Management:    Induction:   PONV Risk Score and Plan: 2  Airway Management Planned:   Additional Equipment:   Intra-op Plan:   Post-operative Plan:   Informed Consent: I have reviewed the patients History and Physical, chart, labs and  discussed the procedure including the risks, benefits and alternatives for the proposed anesthesia with the patient or authorized representative who has indicated his/her understanding and acceptance.     Dental Advisory Given  Plan Discussed with: CRNA  Anesthesia Plan Comments:        Anesthesia Quick Evaluation

## 2023-12-11 NOTE — Anesthesia Procedure Notes (Signed)
 Procedure Name: Intubation Date/Time: 12/11/2023 12:23 PM  Performed by: Noelia Batman, CRNAPre-anesthesia Checklist: Patient identified, Emergency Drugs available, Suction available and Patient being monitored Patient Re-evaluated:Patient Re-evaluated prior to induction Oxygen Delivery Method: Circle System Utilized Preoxygenation: Pre-oxygenation with 100% oxygen Induction Type: IV induction Ventilation: Mask ventilation without difficulty Laryngoscope Size: McGrath and 4 Tube type: Oral Tube size: 8.5 mm Number of attempts: 1 Airway Equipment and Method: Stylet and Oral airway Placement Confirmation: ETT inserted through vocal cords under direct vision, positive ETCO2 and breath sounds checked- equal and bilateral Secured at: 23 cm Tube secured with: Tape Dental Injury: Teeth and Oropharynx as per pre-operative assessment

## 2023-12-11 NOTE — Op Note (Addendum)
 Video Bronchoscopy with Robotic Assisted Bronchoscopic Navigation   Date of Operation: 12/11/2023   Pre-op Diagnosis: lung nodules  Surgeon: Darnelle Elders  Anesthesia: General endotracheal anesthesia  Operation: Flexible video fiberoptic bronchoscopy with robotic assistance and biopsies.  Estimated Blood Loss: Minimal  Complications: None  Indications and History: Joseph Daugherty is a 63 y.o. male with history of of multiple pulmonary nodules presenting for biopsy.  Recommendation made to achieve a tissue diagnosis via robotic assisted navigational bronchoscopy.  The risks, benefits, complications, treatment options and expected outcomes were discussed with the patient.  The possibilities of pneumothorax, pneumonia, reaction to medication, pulmonary aspiration, perforation of a viscus, bleeding, failure to diagnose a condition and creating a complication requiring transfusion or operation were discussed with the patient who freely signed the consent.    Description of Procedure: The patient was seen in the Preoperative Area, was examined and was deemed appropriate to proceed.  The patient was taken to [facility] Endoscopy room 3, identified as Joseph Daugherty and the procedure verified as Flexible Video Fiberoptic Bronchoscopy.  A Time Out was held and the above information confirmed.   Prior to the date of the procedure a high-resolution CT scan of the chest was performed. Utilizing ION software program a virtual tracheobronchial tree was generated to allow the creation of distinct navigation pathways to the patient's parenchymal abnormalities. After being taken to the operating room general anesthesia was initiated and the patient  was orally intubated. The video fiberoptic bronchoscope was introduced via the endotracheal tube and a general inspection was performed which showed normal right and left lung anatomy. Aspiration of the bilateral mainstems was completed to remove any remaining secretions. Robotic  catheter inserted into patient's endotracheal tube.   Target #1 LUL pulmonary nodule: The distinct navigation pathways prepared prior to this procedure were then utilized to navigate to patient's lesion identified on CT scan. The robotic catheter was secured into place and the vision probe was withdrawn.  Lesion location was approximated using fluoroscopy.  Local registration and targeting was performed using GE 3D OEC mobile C-arm three-dimensional imaging. Under fluoroscopic guidance transbronchial brushings, transbronchial needle biopsies, and transbronchial forceps biopsies were performed to be sent for cytology and pathology. Radial EBUS was utilize to further estimate the location of the nodule, correct for CT to body divergence, and assess depth of biopsy. Needle-in-lesion was confirmed using GE 3D mobile C-arm.  A bronchioalveolar lavage was performed in the LUL and sent for microbiology, aspergillus galactomannan and beta-d-glucan.     Target #2 RML pulmonary nodule: The distinct navigation pathways prepared prior to this procedure were then utilized to navigate to patient's lesion identified on CT scan. The robotic catheter was secured into place and the vision probe was withdrawn.  Lesion location was approximated using fluoroscopy.  Local registration and targeting was performed using GE 3D OEC mobile C-arm three-dimensional imaging. Under fluoroscopic guidance transbronchial needle biopsies and transbronchial forceps biopsies were performed to be sent for cytology and pathology.  Needle-in-lesion was confirmed using GE 3D OEC mobile C-arm.      At the end of the procedure a general airway inspection was performed and there was no evidence of active bleeding. The bronchoscope was removed.  The patient tolerated the procedure well. There was no significant blood loss and there were no obvious complications. A post-procedural chest x-ray is pending.  Samples Target #1: LUL nodule 1.  Transbronchial brushings from LUL nodule 2. Transbronchial needle biopsies from LUL nodule 3. Transbronchial forceps biopsies from  LUL nodule 4. Bronchoalveolar lavage from LUL  Samples Target #2: RML nodule 1. Transbronchial needle biopsies from RML nodule 2. Transbronchial forceps biopsies from RML nodule  Plans:  The patient will be discharged from the PACU to home when recovered from anesthesia and after chest x-ray is reviewed. We will review the cytology, pathology and microbiology results with the patient when they become available. Outpatient followup will be with myself.  Vergia Glasgow, MD [facility] 12/11/2023 1:48 PM

## 2023-12-11 NOTE — Interval H&P Note (Signed)
 S: feels well, continues to cough with streaks of blood  O: Vitals:   12/11/23 1109  BP: (!) 135/96  Pulse: (!) 104  Resp: 16  Temp: 98.1 F (36.7 C)  SpO2: 99%   General: Well-appearing and in no distress. Well-nourished Eyes: Anicteric, no conjunctival pallor HEENT: Mucous membranes moist, no evidence of postnasal drip Respiratory: Trachea is midline, no respiratory distress, good bilateral air entry, no wheezes, rales, or rhonchi Cardiovascular: Heart with regular rate and rhythm, normal S1 and S2, no murmurs, rubs, or gallops Neuro: Alert and oriented, no gross focal deficits  A/P: 63 year old male presenting with bilateral pulmonary nodules noted following a respiratory illness. He's had a repeat CT with persistence of the nodules on my review. He presents for robotic assisted navigational bronchoscopy for tissue acquisition to rule out malignancy. Risks and benefits discussed with the patient and he agreeable to proceed. He is appropriate for the procedure.  Vergia Glasgow, MD [facility] 12/11/2023 12:03 PM

## 2023-12-12 ENCOUNTER — Encounter: Payer: Self-pay | Admitting: Student in an Organized Health Care Education/Training Program

## 2023-12-12 LAB — ANCA PROFILE
Anti-MPO Antibodies: <0.2 U (ref 0.0–0.9)
Anti-PR3 Antibodies: <0.2 U (ref 0.0–0.9)
Atypical P-ANCA titer: <1:20 {titer}
C-ANCA: <1:20 {titer}
P-ANCA: <1:20 {titer}

## 2023-12-12 LAB — ANA W/REFLEX: Anti Nuclear Antibody (ANA): NEGATIVE

## 2023-12-12 LAB — SURGICAL PATHOLOGY

## 2023-12-12 NOTE — Anesthesia Postprocedure Evaluation (Signed)
 Anesthesia Post Note  Patient: Joseph Daugherty  Procedure(s) Performed: ROBOTIC ASSISTED NAVIGATIONAL BRONCHOSCOPY (Bilateral)  Patient location during evaluation: PACU Anesthesia Type: General Level of consciousness: awake and alert Pain management: pain level controlled Vital Signs Assessment: post-procedure vital signs reviewed and stable Respiratory status: spontaneous breathing, nonlabored ventilation, respiratory function stable and patient connected to nasal cannula oxygen Cardiovascular status: blood pressure returned to baseline and stable Postop Assessment: no apparent nausea or vomiting Anesthetic complications: no  No notable events documented.   Last Vitals:  Vitals:   12/11/23 1426 12/11/23 1437  BP: (!) 136/90 (!) 146/88  Pulse: 93 86  Resp: 13 17  Temp: (!) 36.3 C (!) 36.1 C  SpO2: 100% 98%    Last Pain:  Vitals:   12/11/23 1437  TempSrc: Temporal  PainSc: 0-No pain                 Enrique Harvest

## 2023-12-13 ENCOUNTER — Ambulatory Visit: Payer: Self-pay | Admitting: Student in an Organized Health Care Education/Training Program

## 2023-12-13 DIAGNOSIS — R911 Solitary pulmonary nodule: Secondary | ICD-10-CM

## 2023-12-13 LAB — CYTOLOGY - NON PAP

## 2023-12-13 LAB — FUNGITELL BETA-D-GLUCAN
Fungitell Value:: <31.25 pg/mL
Result Name:: NEGATIVE

## 2023-12-13 MED ORDER — CEFPODOXIME PROXETIL 200 MG PO TABS: 200.0000 mg | ORAL_TABLET | Freq: Two times a day (BID) | ORAL | 0 refills | Status: AC

## 2023-12-14 LAB — ACID FAST SMEAR (AFB, MYCOBACTERIA)
Acid Fast Smear: NEGATIVE
Acid Fast Smear: NEGATIVE
Acid Fast Smear: NEGATIVE

## 2023-12-14 LAB — CULTURE, RESPIRATORY W GRAM STAIN
Culture: NO GROWTH
Gram Stain: NONE SEEN

## 2023-12-14 LAB — ASPERGILLUS ANTIGEN, BAL/SERUM: Aspergillus Ag, BAL/Serum: 0.08 {index} (ref 0.00–0.49)

## 2023-12-17 LAB — CULTURE, RESPIRATORY W GRAM STAIN

## 2023-12-26 ENCOUNTER — Ambulatory Visit (INDEPENDENT_AMBULATORY_CARE_PROVIDER_SITE_OTHER): Admitting: Student in an Organized Health Care Education/Training Program

## 2023-12-26 ENCOUNTER — Encounter: Payer: Self-pay | Admitting: Student in an Organized Health Care Education/Training Program

## 2023-12-26 VITALS — BP 128/72 | HR 74 | Temp 97.1°F | Ht 76.0 in | Wt 226.6 lb

## 2023-12-26 DIAGNOSIS — R911 Solitary pulmonary nodule: Secondary | ICD-10-CM

## 2023-12-26 DIAGNOSIS — J189 Pneumonia, unspecified organism: Secondary | ICD-10-CM | POA: Diagnosis not present

## 2023-12-26 DIAGNOSIS — F17210 Nicotine dependence, cigarettes, uncomplicated: Secondary | ICD-10-CM

## 2023-12-26 MED ORDER — CEFPODOXIME PROXETIL 200 MG PO TABS: 200.0000 mg | ORAL_TABLET | Freq: Two times a day (BID) | ORAL | 0 refills | Status: AC

## 2023-12-26 NOTE — Progress Notes (Signed)
 Assessment & Plan:   1. Lung nodule (Primary) 2. Pneumonia of both lungs due to infectious organism, unspecified part of lung  Patient presented for the evaluation of multiple pulmonary nodules, largest of which being in the left upper lobe, following what appeared to be prodromal symptoms of infection.  Given repeat CT scan (super D protocol) showed growth in the nodules, we proceeded with robotic assisted navigational bronchoscopy on 12/11/2023 with biopsy of a left upper lobe nodule, right middle lobe nodule, and a BAL.  Pathology reports have returned showing signs of infection on both FNA and surgical path from both nodules.  Culture from the BAL has grown Streptococcus intermedius.  Rest of the infectious workup has been negative, including AFB and fungal smears/cultures.  ANA and ANCA were also negative.  Given this, I prescribed him a 10-day course of cefpodoxime  which has resulted in significant improvement in his symptoms.  His cough is now resolved and he has improved appetite.  Given severity of infection, I will prolong his antibiotic course by 5 more days.  I will also obtain a repeat CT scan in 3 months to document resolution.  I have notified the patient of the importance of calling us  to let us  know if any of the symptoms recur.  While he has multiple pulmonary nodules that are infectious in nature, I am not suspicious of a hematogenous process or bacteremia as the culprit.  Should he develop fevers or recurrent symptoms, however, I would consider referral to ID as well as a TEE.  - cefpodoxime  (VANTIN ) 200 MG tablet; Take 1 tablet (200 mg total) by mouth 2 (two) times daily for 5 days.  Dispense: 10 tablet; Refill: 0 - CT CHEST WO CONTRAST; in 3 months   Return in about 3 months (around 03/27/2024).  I spent 30 minutes caring for this patient today, including preparing to see the patient, obtaining a medical history , reviewing a separately obtained history, performing a medically  appropriate examination and/or evaluation, counseling and educating the patient/family/caregiver, ordering medications, tests, or procedures, documenting clinical information in the electronic health record, and independently interpreting results (not separately reported/billed) and communicating results to the patient/family/caregiver  Vergia Glasgow, MD [facility]   End of visit medications:  Meds ordered this encounter  Medications   cefpodoxime  (VANTIN ) 200 MG tablet    Sig: Take 1 tablet (200 mg total) by mouth 2 (two) times daily for 5 days.    Dispense:  10 tablet    Refill:  0     Current Outpatient Medications:    aspirin  81 MG chewable tablet, Chew 81 mg by mouth daily., Disp: , Rfl:    cefpodoxime  (VANTIN ) 200 MG tablet, Take 1 tablet (200 mg total) by mouth 2 (two) times daily for 5 days., Disp: 10 tablet, Rfl: 0   celecoxib (CELEBREX) 200 MG capsule, Take 200 mg by mouth daily., Disp: , Rfl:    chlorpheniramine-HYDROcodone  (TUSSIONEX) 10-8 MG/5ML, Take 5 mLs by mouth every 12 (twelve) hours as needed for cough., Disp: 70 mL, Rfl: 0   cyclobenzaprine (FLEXERIL) 5 MG tablet, Take by mouth., Disp: , Rfl:    finasteride  (PROSCAR ) 5 MG tablet, Take 1 tablet (5 mg total) by mouth daily., Disp: 90 tablet, Rfl: 3   glipiZIDE (GLUCOTROL XL) 10 MG 24 hr tablet, Take 10 mg by mouth daily with breakfast., Disp: , Rfl:    JARDIANCE 25 MG TABS tablet, Take 25 mg by mouth daily., Disp: , Rfl:  lisinopril-hydrochlorothiazide (ZESTORETIC) 20-12.5 MG tablet, Take 1 tablet by mouth daily., Disp: , Rfl:    metFORMIN (GLUCOPHAGE) 1000 MG tablet, Take 1,000 mg by mouth 2 (two) times daily., Disp: , Rfl:    metoprolol succinate (TOPROL-XL) 25 MG 24 hr tablet, Take 25 mg by mouth daily., Disp: , Rfl:    pioglitazone (ACTOS) 30 MG tablet, Take 30 mg by mouth daily., Disp: , Rfl:    rosuvastatin (CRESTOR) 20 MG tablet, Take 20 mg by mouth daily., Disp: , Rfl:    sildenafil  (VIAGRA) 100 MG tablet, TAKE (1) TABLET BY MOUTH 1 HOUR PRIOR TO SEXUAL ACTIVITY. ~MAX OF 1 TAB PER DAY~, Disp: , Rfl:    sitaGLIPtin-metformin (JANUMET) 50-1000 MG tablet, Take 1 tablet by mouth 2 (two) times daily with a meal., Disp: , Rfl:    spironolactone  (ALDACTONE ) 25 MG tablet, Take 0.5 tablets (12.5 mg total) by mouth daily., Disp: 30 tablet, Rfl: 1   tamsulosin  (FLOMAX ) 0.4 MG CAPS capsule, Take 1 capsule (0.4 mg total) by mouth daily., Disp: 90 capsule, Rfl: 3   Fluticasone-Salmeterol (ADVAIR) 100-50 MCG/DOSE AEPB, Inhale into the lungs. (Patient not taking: Reported on 12/26/2023), Disp: , Rfl:    levofloxacin  (LEVAQUIN ) 500 MG tablet, Take 500 mg by mouth daily. (Patient not taking: Reported on 12/11/2023), Disp: , Rfl:    meloxicam (MOBIC) 15 MG tablet, Take 15 mg by mouth daily. (Patient not taking: Reported on 12/11/2023), Disp: , Rfl:    Subjective:   PATIENT ID: Joseph Daugherty GENDER: male DOB: [birth year], MRN: [id number]  Chief Complaint  Patient presents with   Follow-up    Bronchoscopy on 12/11/2023. No SOB, wheezing or cough.    HPI  Patient is a pleasant 63 year old male presenting for follow up after robotic assisted navigational bronchoscopy.   Patient initially presented to clinic on 5/14 with chief complaint of cough and shortness of breath since April. He'd had 3 presentations to the emergency department.  He initially presented to the ED on 10/28/2023 with sharp pleuritic chest pain treated symptomatically with a normal chest x-ray.  He then presented again on 11/07/2023 with similar symptoms in addition to a cough.  His cough was productive of sputum work chest x-ray showed multifocal infiltrates treated with doxycycline .  He again presented back on 11/13/2023 with worsening shortness of breath, cough, and chest pain.  At that time, a CT scan of the chest was performed showing multiple nodular densities in the lung.  Blood work showed mild leukocytosis.  He was prescribed a  course of levofloxacin  on 11/14/2023 and referred to pulmonary for further evaluation. He had had a LDCT 05/2023 for lung cancer screening that was unremarkable and without nodularity. It did show emphysema.   Patient was scheduled for robotic assisted navigational bronchoscopy on 12/11/2023. Both LUL and RML nodules were biopsied, with pathology result showing proteinaceous material, macrophages, and signs of infection in both nodules. BAL grew strep intermedius. AFB and Fungal cultures and smears are negative to date, as were b-d-glucan and Aspergillus antigens. ANA and ANCA were similarly negative.  I then prescribed the patient a 10 day course of antibiotics with Cefpodoxime , and he's felt significant improvement with them. His cough is nearly resolved, and he has no more sputum production or hemoptysis. He has no chest pain. He reports that his appetite is back and much improved. He has no fevers, chills, or night sweats.   Patient works as a Theatre manager.  He denies any occupational exposures and  also denies any significant sick contacts.  He is a smoker, having smoked three quarters of a pack for around 40 years.  He probably has 40 pack years of smoking history.  Ancillary information including prior medications, full medical/surgical/family/social histories, and PFTs (when available) are listed below and have been reviewed.   Review of Systems  Constitutional:  Negative for chills, fever and weight loss.  Respiratory:  Negative for cough, sputum production, shortness of breath and wheezing.   Cardiovascular:  Negative for chest pain.     Objective:   Vitals:   12/26/23 0922  BP: 128/72  Pulse: 74  Temp: (!) 97.1 F (36.2 C)  SpO2: 100%  Weight: 226 lb 9.6 oz (102.8 kg)  Height: 6\' 4"  (1.93 m)   100% on RA BMI Readings from Last 3 Encounters:  12/26/23 27.58 kg/m  12/11/23 28.00 kg/m  12/10/23 28.00 kg/m   Wt Readings from Last 3 Encounters:  12/26/23 226 lb  9.6 oz (102.8 kg)  12/11/23 230 lb (104.3 kg)  12/10/23 230 lb (104.3 kg)    Physical Exam Constitutional:      Appearance: Normal appearance.  Cardiovascular:     Rate and Rhythm: Normal rate and regular rhythm.     Pulses: Normal pulses.     Heart sounds: Normal heart sounds.  Pulmonary:     Effort: Pulmonary effort is normal.     Breath sounds: Normal breath sounds. No wheezing or rales.  Neurological:     General: No focal deficit present.     Mental Status: He is alert and oriented to person, place, and time. Mental status is at baseline.       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 No pertinent family history.   Past Surgical History:  Procedure Laterality Date   BRONCHOSCOPY, WITH BIOPSY USING ELECTROMAGNETIC NAVIGATION Bilateral 12/11/2023   Procedure: ROBOTIC ASSISTED NAVIGATIONAL BRONCHOSCOPY;  Surgeon: Vergia Glasgow, MD;  Location: [facility] ORS;  Service: Pulmonary;  Laterality: Bilateral;   PROSTATE BIOPSY  01/30/2020    Social History   Socioeconomic History   Marital status: Single    Spouse name: Not on file   Number of children: Not on file   Years of education: Not on file   Highest education level: Not on file  Occupational History   Not on file  Tobacco Use   Smoking status: Every Day    Current packs/day: 0.25    Average packs/day: 1 pack/day for 43.9 years (43.8 ttl pk-yrs)    Types: Cigarettes    Start date: 02/05/1980   Smokeless tobacco: Never   Tobacco comments:    Smoking about 8 cigarettes a day now, at his heaviest 1-1.5 PPD-- khj 12/26/2023  Substance and Sexual Activity   Alcohol use: Never   Drug use: Not Currently   Sexual activity: Yes     Birth control/protection: None  Other Topics Concern   Not on file  Social History Narrative   Not on file   Social Drivers of Health   Financial Resource Strain: Low Risk  (11/07/2023)   Received from [facility]   Overall Financial Resource Strain (CARDIA)    Difficulty of Paying Living  Expenses: Not hard at all  Food Insecurity: No Food Insecurity (11/07/2023)   Received from [facility]   Hunger Vital Sign    Worried About Running Out of Food in the Last Year: Never true    Ran Out of Food in the Last Year: Never true  Transportation Needs: No Transportation Needs (11/07/2023)   Received from [facility] - Transportation    In the past 12 months, has lack of transportation kept you from medical appointments or from getting medications?: No    Lack of Transportation (Non-Medical): No  Physical Activity: Not on File (02/26/2019)   Received from [place], [place]   Physical Activity    Physical Activity: 0  Stress: Not on File (02/26/2019)   Received from [place], [place]   Stress    Stress: 0  Social Connections: Not on File (04/01/2023)   Received from [facility]   Social Connections    Connectedness: 0  Intimate Partner Violence: Not on file     Allergies  Allergen Reactions   Penicillins Hives and Anaphylaxis     CBC    Component Value Date/Time   WBC 10.7 (H) 11/12/2023 2232   RBC 4.50 11/12/2023 2232   HGB 13.0 11/12/2023 2232   HCT 39.1 11/12/2023 2232   PLT 333 11/12/2023 2232   MCV 86.9 11/12/2023 2232   MCH 28.9 11/12/2023 2232   MCHC 33.2 11/12/2023 2232   RDW 13.1 11/12/2023 2232   LYMPHSABS 3.3 08/17/2022 0836   MONOABS 1.0 08/17/2022 0836   EOSABS 0.2 08/17/2022 0836   BASOSABS 0.0 08/17/2022 0836    Pulmonary Functions Testing Results:     No data to display          Outpatient Medications Prior to Visit  Medication Sig Dispense Refill   aspirin  81 MG chewable tablet Chew 81 mg by mouth  daily.     celecoxib (CELEBREX) 200 MG capsule Take 200 mg by mouth daily.     chlorpheniramine-HYDROcodone  (TUSSIONEX) 10-8 MG/5ML Take 5 mLs by mouth every 12 (twelve) hours as needed for cough. 70 mL 0   cyclobenzaprine (FLEXERIL) 5 MG tablet Take by mouth.     finasteride  (PROSCAR ) 5 MG tablet Take 1 tablet (5 mg total) by mouth daily. 90 tablet 3   glipiZIDE (GLUCOTROL XL) 10 MG 24 hr tablet Take 10 mg by mouth daily with breakfast.     JARDIANCE 25 MG TABS tablet Take 25 mg by mouth daily.     lisinopril-hydrochlorothiazide (ZESTORETIC) 20-12.5 MG tablet Take 1 tablet by mouth daily.     metFORMIN (GLUCOPHAGE) 1000 MG tablet Take 1,000 mg by mouth 2 (two) times daily.     metoprolol succinate (TOPROL-XL) 25 MG 24 hr tablet Take 25 mg by mouth daily.     pioglitazone (ACTOS) 30 MG tablet Take 30 mg by mouth daily.     rosuvastatin (CRESTOR) 20 MG tablet Take 20 mg by mouth daily.     sildenafil (VIAGRA) 100 MG tablet TAKE (1) TABLET BY MOUTH 1 HOUR PRIOR TO SEXUAL ACTIVITY. ~MAX OF 1 TAB PER DAY~     sitaGLIPtin-metformin (JANUMET) 50-1000 MG tablet Take 1 tablet by mouth 2 (two) times daily with a meal.     spironolactone  (ALDACTONE ) 25 MG tablet Take 0.5 tablets (12.5 mg total) by mouth daily. 30 tablet 1   tamsulosin  (FLOMAX ) 0.4 MG CAPS capsule Take 1 capsule (0.4 mg total) by mouth daily. 90 capsule 3  Fluticasone-Salmeterol (ADVAIR) 100-50 MCG/DOSE AEPB Inhale into the lungs. (Patient not taking: Reported on 12/26/2023)     levofloxacin  (LEVAQUIN ) 500 MG tablet Take 500 mg by mouth daily. (Patient not taking: Reported on 12/11/2023)     meloxicam (MOBIC) 15 MG tablet Take 15 mg by mouth daily. (Patient not taking: Reported on 12/11/2023)     No facility-administered medications prior to visit.

## 2024-01-01 ENCOUNTER — Ambulatory Visit (INDEPENDENT_AMBULATORY_CARE_PROVIDER_SITE_OTHER): Admitting: Cardiovascular Disease

## 2024-01-01 ENCOUNTER — Encounter: Payer: Self-pay | Admitting: Cardiovascular Disease

## 2024-01-01 VITALS — BP 128/78 | HR 85 | Ht 76.0 in | Wt 226.0 lb

## 2024-01-01 DIAGNOSIS — F17200 Nicotine dependence, unspecified, uncomplicated: Secondary | ICD-10-CM | POA: Diagnosis not present

## 2024-01-01 DIAGNOSIS — R0789 Other chest pain: Secondary | ICD-10-CM

## 2024-01-01 DIAGNOSIS — I340 Nonrheumatic mitral (valve) insufficiency: Secondary | ICD-10-CM

## 2024-01-01 DIAGNOSIS — I5032 Chronic diastolic (congestive) heart failure: Secondary | ICD-10-CM

## 2024-01-01 DIAGNOSIS — E782 Mixed hyperlipidemia: Secondary | ICD-10-CM | POA: Diagnosis not present

## 2024-01-01 DIAGNOSIS — F17210 Nicotine dependence, cigarettes, uncomplicated: Secondary | ICD-10-CM | POA: Diagnosis not present

## 2024-01-01 DIAGNOSIS — I10 Essential (primary) hypertension: Secondary | ICD-10-CM

## 2024-01-01 NOTE — Progress Notes (Signed)
 Cardiology Office Note   Date:  01/01/2024   ID:  Joseph Daugherty, DOB [birth year], MRN [id number]  PCP:  Rex Castor, MD  Cardiologist:  Debborah Fairly, MD      History of Present Illness: Joseph Daugherty is a 63 y.o. male who presents for  Chief Complaint  Patient presents with   Follow-up    5 weeks follow up    Feels better after aldactone .      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     celecoxib (CELEBREX) 200 MG capsule Take 200 mg by mouth daily.     cyclobenzaprine (FLEXERIL) 5 MG tablet Take by mouth.     finasteride  (PROSCAR ) 5 MG tablet Take 1 tablet (5 mg total) by mouth daily. 90 tablet 3   glipiZIDE (GLUCOTROL XL) 10 MG 24 hr tablet Take 10 mg by mouth daily with breakfast.     JARDIANCE 25 MG TABS tablet Take 25 mg by mouth daily.     lisinopril-hydrochlorothiazide (ZESTORETIC) 20-12.5 MG tablet Take 1 tablet by mouth daily.     metFORMIN (GLUCOPHAGE) 1000 MG tablet Take 1,000 mg by mouth 2 (two) times daily.     metoprolol succinate (TOPROL-XL) 25 MG 24 hr tablet Take 25 mg by mouth daily.     pioglitazone (ACTOS) 30 MG tablet Take 30 mg by mouth daily.     rosuvastatin (CRESTOR) 20 MG tablet Take 20 mg by mouth daily.     sildenafil (VIAGRA) 100 MG tablet TAKE  (1) TABLET BY MOUTH 1 HOUR PRIOR TO SEXUAL ACTIVITY. ~MAX OF 1 TAB PER DAY~     sitaGLIPtin-metformin (JANUMET) 50-1000 MG tablet Take 1 tablet by mouth 2 (two) times daily with a meal.     spironolactone  (ALDACTONE ) 25 MG tablet Take 0.5 tablets (12.5 mg total) by mouth daily. 30 tablet 1   tamsulosin  (FLOMAX ) 0.4 MG CAPS capsule Take 1 capsule (0.4 mg total) by mouth daily. 90 capsule 3   No current facility-administered medications for this visit.    Allergies:   Penicillins    Social History:   reports that he has been smoking cigarettes. He started smoking about 43 years ago. He has a 43.8 pack-year smoking history. He has never used smokeless tobacco. He reports that he does not currently use drugs. He reports that he does not drink alcohol.   Family History:  family history is not on file.    ROS:     Review of Systems  Constitutional: Negative.   HENT: Negative.    Eyes: Negative.   Respiratory: Negative.    Gastrointestinal: Negative.   Genitourinary: Negative.   Musculoskeletal: Negative.   Skin: Negative.   Neurological: Negative.   Endo/Heme/Allergies: Negative.   Psychiatric/Behavioral: Negative.    All other systems reviewed and are negative.  All other systems are reviewed and negative.    PHYSICAL EXAM: VS:  BP 128/78   Pulse 85   Ht 6\' 4"  (1.93 m)   Wt 226 lb (102.5 kg)   SpO2 95%   BMI 27.51 kg/m  , BMI Body mass index is 27.51 kg/m. Last weight:  Wt Readings from Last 3 Encounters:  01/01/24 226 lb (102.5 kg)  12/26/23 226 lb 9.6 oz (102.8 kg)  12/11/23 230 lb (104.3 kg)     Physical Exam Vitals reviewed.  Constitutional:      Appearance: Normal appearance. He is normal weight.  HENT:     Head: Normocephalic.     Nose: Nose normal.     Mouth/Throat:     Mouth: Mucous membranes are moist.  Eyes:     Pupils: Pupils are equal, round, and reactive to light.  Cardiovascular:     Rate and Rhythm: Normal rate and regular rhythm.      Pulses: Normal pulses.     Heart sounds: Normal heart sounds.  Pulmonary:     Effort: Pulmonary effort is normal.  Abdominal:     General: Abdomen is flat. Bowel sounds are normal.  Musculoskeletal:        General: Normal range of motion.     Cervical back: Normal range of motion.  Skin:    General: Skin is warm.  Neurological:     General: No focal deficit present.     Mental Status: He is alert.  Psychiatric:        Mood and Affect: Mood normal.       EKG:   Recent Labs: 10/28/2023: ALT 19 11/12/2023: Hemoglobin 13.0; Platelets 333 12/11/2023: BUN 17; Creatinine, Ser 0.90; Potassium 4.1; Sodium 134    Lipid Panel No results found for: "CHOL", "TRIG", "HDL", "CHOLHDL", "VLDL", "LDLCALC", "LDLDIRECT"    Other studies Reviewed: Additional studies/ records that were reviewed today include:  Review of the above records demonstrates:       No data to display            ASSESSMENT AND PLAN:    ICD-10-CM   1. Chronic diastolic congestive heart failure (HCC)  I50.32    LVEF normal on echo but has grade diastolic dysfunction, on jaurdiance. better on aldactone     2. Current smoker  F17.200     3. Mixed hyperlipidemia  E78.2     4. Cigarette nicotine dependence without complication  F17.210     5. Hypertension, benign  I10     6. Other chest pain  R07.89    stress test normal    7. Nonrheumatic mitral valve regurgitation  I34.0        Problem List Items Addressed This Visit       Cardiovascular and Mediastinum   Hypertension, benign     Other   Current smoker   HLD (hyperlipidemia)   Nicotine dependence   Other Visit Diagnoses       Chronic diastolic congestive heart failure (HCC)    -  Primary   LVEF normal on echo but has grade diastolic dysfunction, on jaurdiance. better on aldactone      Other chest pain       stress test normal     Nonrheumatic mitral valve regurgitation              Disposition:   Return in about 3 months (around  04/02/2024).    Total time spent: 30 minutes  Signed,  Debborah Fairly, MD  01/01/2024 2:08 PM    [facility]

## 2024-01-10 LAB — FUNGUS CULTURE WITH STAIN

## 2024-01-10 LAB — FUNGAL ORGANISM REFLEX

## 2024-01-10 LAB — FUNGUS CULTURE RESULT

## 2024-01-25 LAB — ACID FAST CULTURE WITH REFLEXED SENSITIVITIES (MYCOBACTERIA)
Acid Fast Culture: NEGATIVE
Acid Fast Culture: NEGATIVE
Acid Fast Culture: NEGATIVE

## 2024-02-02 ENCOUNTER — Emergency Department
Admission: EM | Admit: 2024-02-02 | Discharge: 2024-02-02 | Disposition: A | Discharge status: Home / Self Care | Attending: Emergency Medicine | Admitting: Emergency Medicine

## 2024-02-02 ENCOUNTER — Emergency Department

## 2024-02-02 ENCOUNTER — Other Ambulatory Visit: Payer: Self-pay

## 2024-02-02 DIAGNOSIS — R109 Unspecified abdominal pain: Secondary | ICD-10-CM | POA: Diagnosis present

## 2024-02-02 DIAGNOSIS — K861 Other chronic pancreatitis: Secondary | ICD-10-CM | POA: Insufficient documentation

## 2024-02-02 HISTORY — DX: Acute pancreatitis without necrosis or infection, unspecified: K85.90

## 2024-02-02 LAB — URINALYSIS, ROUTINE W REFLEX MICROSCOPIC
Bacteria, UA: NONE SEEN
Bilirubin Urine: NEGATIVE
Glucose, UA: >=500 mg/dL — AB
Hgb urine dipstick: NEGATIVE
Ketones, ur: NEGATIVE mg/dL
Leukocytes,Ua: NEGATIVE
Nitrite: NEGATIVE
Protein, ur: NEGATIVE mg/dL
Specific Gravity, Urine: 1.012 (ref 1.005–1.030)
pH: 5 (ref 5.0–8.0)

## 2024-02-02 LAB — CBC
HCT: 39.6 % (ref 39.0–52.0)
Hemoglobin: 13 g/dL (ref 13.0–17.0)
MCH: 28.1 pg (ref 26.0–34.0)
MCHC: 32.8 g/dL (ref 30.0–36.0)
MCV: 85.7 fL (ref 80.0–100.0)
Platelets: 278 K/uL (ref 150–400)
RBC: 4.62 MIL/uL (ref 4.22–5.81)
RDW: 15 % (ref 11.5–15.5)
WBC: 7.6 K/uL (ref 4.0–10.5)
nRBC: 0 % (ref 0.0–0.2)

## 2024-02-02 LAB — COMPREHENSIVE METABOLIC PANEL WITH GFR
ALT: 14 U/L (ref 0–44)
AST: 25 U/L (ref 15–41)
Albumin: 3.7 g/dL (ref 3.5–5.0)
Alkaline Phosphatase: 38 U/L (ref 38–126)
Anion gap: 7 (ref 5–15)
BUN: 19 mg/dL (ref 8–23)
CO2: 26 mmol/L (ref 22–32)
Calcium: 9.1 mg/dL (ref 8.9–10.3)
Chloride: 104 mmol/L (ref 98–111)
Creatinine, Ser: 0.99 mg/dL (ref 0.61–1.24)
GFR, Estimated: >60 mL/min (ref >60)
Glucose, Bld: 113 mg/dL — ABNORMAL HIGH (ref 70–99)
Potassium: 4.6 mmol/L (ref 3.5–5.1)
Sodium: 137 mmol/L (ref 135–145)
Total Bilirubin: 0.9 mg/dL (ref 0.0–1.2)
Total Protein: 6.5 g/dL (ref 6.5–8.1)

## 2024-02-02 LAB — TROPONIN I (HIGH SENSITIVITY)
Troponin I (High Sensitivity): 7 ng/L (ref <18)
Troponin I (High Sensitivity): 7 ng/L (ref <18)

## 2024-02-02 LAB — TRIGLYCERIDES: Triglycerides: 30 mg/dL (ref <150)

## 2024-02-02 LAB — LIPASE, BLOOD: Lipase: 34 U/L (ref 11–51)

## 2024-02-02 MED ORDER — ONDANSETRON 4 MG PO TBDP: 4.0000 mg | ORAL_TABLET | Freq: Three times a day (TID) | ORAL | 0 refills | Status: AC | PRN

## 2024-02-02 MED ORDER — PANTOPRAZOLE SODIUM 40 MG PO TBEC: 40.0000 mg | DELAYED_RELEASE_TABLET | Freq: Every day | ORAL | 0 refills | Status: DC

## 2024-02-02 MED ORDER — SUCRALFATE 1 G PO TABS: 1.0000 g | ORAL_TABLET | Freq: Three times a day (TID) | ORAL | 0 refills | Status: DC

## 2024-02-02 MED ORDER — ONDANSETRON HCL 4 MG/2ML IJ SOLN
4.0000 mg | Freq: Once | INTRAMUSCULAR | Status: AC
  Administered 2024-02-02: 4 mg via INTRAVENOUS
  Filled 2024-02-02: qty 2

## 2024-02-02 MED ORDER — SODIUM CHLORIDE 0.9 % IV BOLUS
1000.0000 mL | Freq: Once | INTRAVENOUS | Status: AC
  Administered 2024-02-02: 1000 mL via INTRAVENOUS

## 2024-02-02 MED ORDER — OXYCODONE HCL 5 MG PO TABS: 5.0000 mg | ORAL_TABLET | Freq: Four times a day (QID) | ORAL | 0 refills | Status: AC | PRN

## 2024-02-02 MED ORDER — MORPHINE SULFATE (PF) 4 MG/ML IV SOLN
4.0000 mg | Freq: Once | INTRAVENOUS | Status: AC
  Administered 2024-02-02: 4 mg via INTRAVENOUS
  Filled 2024-02-02: qty 1

## 2024-02-02 NOTE — ED Triage Notes (Signed)
 Pt to ED for possible pancreatitis flare. States having pain since last night, L side and upper back. Denies alcohol use or new foods.

## 2024-02-02 NOTE — Discharge Instructions (Signed)
 Clear liquids are okay when restarting your diet you should eat small low-fat meals and gradually advance over the next 3 to 6 days.  Avoid any alcohol.  Take Tylenol  1 g every 8 hours over the next 1 week to help with pain.  Only use the oxycodone  for breakthrough pain and to use a capful of MiraLAX  to prevent constipation.  Develop worsening symptoms you should return to the ER.  We discussed CT imaging today but of opted to hold off but if you develop a change in symptoms or worsening symptoms then please be reevaluated.  You should also call GI to make a follow-up appointment

## 2024-02-02 NOTE — ED Notes (Signed)
 ED Provider at bedside.

## 2024-02-02 NOTE — ED Provider Notes (Signed)
 [facility] Provider Note    Event Date/Time   First MD Initiated Contact with Patient 02/02/24 [phone]     (approximate)   History   Abdominal Pain   HPI  Joseph Daugherty is a 63 y.o. male with BPH, pancreatitis who comes in with concerns for abdominal pain.  Patient comes in with pain since last night on his left side denies any alcohol use or new foods.  States that it feels similar to his prior pancreatitis flares.  Patient reports that he is because a similar pancreatitis flareup.  He reports that it started yesterday after eating.  He reports not being able to eat since then.  He is unclear why he keeps having these flareups.  He states that it feels very similar to when he was here back in February.  He denies any chest pain, shortness of breath, lower abdominal pain, fevers or other concerns.  He reports still not drinking any alcohol.  I reviewed the ER note where patient was seen on 09/05/2023 where patient had mild pancreatitis.  He had CT imaging done that did not show any other acute pathology and his lipase was not 90.  Physical Exam   Triage Vital Signs: ED Triage Vitals  Encounter Vitals Group     BP 02/02/24 0831 127/86     Girls Systolic BP Percentile --      Girls Diastolic BP Percentile --      Boys Systolic BP Percentile --      Boys Diastolic BP Percentile --      Pulse Rate 02/02/24 0831 81     Resp 02/02/24 0831 20     Temp 02/02/24 0831 98 F (36.7 C)     Temp Source 02/02/24 0831 Oral     SpO2 02/02/24 0831 98 %     Weight 02/02/24 0829 234 lb (106.1 kg)     Height 02/02/24 0829 6' 4 (1.93 m)     Head Circumference --      Peak Flow --      Pain Score 02/02/24 0829 9     Pain Loc --      Pain Education --      Exclude from Growth Chart --     Most recent vital signs: Vitals:   02/02/24 0831  BP: 127/86  Pulse: 81  Resp: 20  Temp: 98 F (36.7 C)  SpO2: 98%     General: Awake, no distress.  CV:  Good peripheral  perfusion.  Resp:  Normal effort.  Abd:  No distention.  Slight tenderness on the left flank but no rebound, no guarding no lower abdominal tenderness with palpation. Other:  No rash noted    ED Results / Procedures / Treatments   Labs (all labs ordered are listed, but only abnormal results are displayed) Labs Reviewed  LIPASE, BLOOD  COMPREHENSIVE METABOLIC PANEL WITH GFR  CBC  URINALYSIS, ROUTINE W REFLEX MICROSCOPIC     EKG  My interpretation of EKG:  Sinus rate of 77 without any ST elevation or T wave inversions, normal intervals  RADIOLOGY I have reviewed the us  personally and interpreted no evidence of gallstones   PROCEDURES:  Critical Care performed: No  Procedures   MEDICATIONS ORDERED IN ED: Medications - No data to display   IMPRESSION / MDM / ASSESSMENT AND PLAN / ED COURSE  I reviewed the triage vital signs and the nursing notes.   Patient's presentation is most consistent with acute  presentation with potential threat to life or bodily function.   Patient comes in with left sided abdominal pain.  I suspect this is related to patient's known recurrent pancreatitis.  Patient is not followed up with GI.  I will check triglycerides and as well as get ultrasound to ensure no gallstones that could be causing recurrent pancreatitis.  I do not see any rash to suggest shingles.  He is got no lower abdominal pain to suggest diverticulitis, appendicitis.  He has had a recent CT scan 5 months ago without any evidence of cancer just showed some mild pancreatitis.  Troponins are negative x 2 urine without evidence of UTI.  Triglycerides negative lipase is normal CMP shows normal kidney function CBC reassuring  After medications patient is tolerating p.o. and did eat some chips and drink some Gatorade.  Discussed with patient that he should be on a liquid diet and not eating chips although he was able to tolerate this without any vomiting.  Although his lipase is normal  I suspect that he could have a component of some chronic pancreatitis and his lipase may not be as accurate.  We did a repeat abdominal exam which remains soft and nontender no rebound, no guarding and he is tolerating p.o. therefore low suspicion for acute abdominal pathology and we discussed CT imaging and patient was okay with holding off.  Will prescribe a few pain medications but we discussed limited use and trialing Tylenol  preferably, oxycodone  for breakthrough pain, Zofran  for nausea, liquid diet and following up with GI.  Patient expressed understanding and felt comfortable with this plan and will return to the ER if he develops recurrent symptoms or any other concerns    The patient is on the cardiac monitor to evaluate for evidence of arrhythmia and/or significant heart rate changes.      FINAL CLINICAL IMPRESSION(S) / ED DIAGNOSES   Final diagnoses:  Chronic pancreatitis, unspecified pancreatitis type (HCC)     Rx / DC Orders   ED Discharge Orders          Ordered    oxyCODONE  (ROXICODONE ) 5 MG immediate release tablet  Every 6 hours PRN        02/02/24 1139    ondansetron  (ZOFRAN -ODT) 4 MG disintegrating tablet  Every 8 hours PRN        02/02/24 1139    pantoprazole  (PROTONIX ) 40 MG tablet  Daily        02/02/24 1139    sucralfate  (CARAFATE ) 1 g tablet  3 times daily with meals & bedtime        02/02/24 1139             Note:  This document was prepared using Dragon voice recognition software and may include unintentional dictation errors.   Ernest Ronal BRAVO, MD 02/02/24 1141

## 2024-02-03 ENCOUNTER — Other Ambulatory Visit: Payer: Self-pay

## 2024-02-03 ENCOUNTER — Emergency Department
Admission: EM | Admit: 2024-02-03 | Discharge: 2024-02-03 | Disposition: A | Discharge status: Home / Self Care | Attending: Emergency Medicine | Admitting: Emergency Medicine

## 2024-02-03 ENCOUNTER — Emergency Department

## 2024-02-03 DIAGNOSIS — R0781 Pleurodynia: Secondary | ICD-10-CM | POA: Diagnosis present

## 2024-02-03 MED ORDER — METHOCARBAMOL 500 MG PO TABS: 500.0000 mg | ORAL_TABLET | Freq: Three times a day (TID) | ORAL | 0 refills | Status: AC | PRN

## 2024-02-03 MED ORDER — KETOROLAC TROMETHAMINE 60 MG/2ML IM SOLN
30.0000 mg | Freq: Once | INTRAMUSCULAR | Status: AC
  Administered 2024-02-03: 30 mg via INTRAMUSCULAR
  Filled 2024-02-03: qty 2

## 2024-02-03 MED ORDER — LIDOCAINE 5 % EX PTCH
1.0000 | MEDICATED_PATCH | Freq: Once | CUTANEOUS | Status: DC
  Administered 2024-02-03: 1 via TRANSDERMAL
  Filled 2024-02-03: qty 1

## 2024-02-03 NOTE — ED Triage Notes (Signed)
 Pt comes with left sided rib pain. Pt states he is not sure if he broke a rib or pulled something when he was getting in and out of this smaller car.

## 2024-02-03 NOTE — ED Provider Notes (Signed)
 [facility] Provider Note    Event Date/Time   First MD Initiated Contact with Patient 02/03/24 1116     (approximate)   History   Rib Injury   HPI Joseph Daugherty is a 63 y.o. male with prior history of pancreatitis presenting today for rib injury versus strain.  Patient states he was seen in the ED last night because he originally thought it might of been related to his chronic pancreatitis and felt similar.  He had extensive workup performed which was largely reassuring and stable for discharge at that time.  He states he then remembered that when getting out of his car earlier this week he thinks he might of strained the muscles or injured a rib on that side and wanted to have that checked out.  He denies any other new symptoms such as new chest pain, shortness of breath, abdominal pain, nausea, vomiting, lightheadedness.  Chart review: Reviewed ED notes from yesterday which included negative cardiac workup as well as abdominal workup with US  imaging.     Physical Exam   Triage Vital Signs: ED Triage Vitals  Encounter Vitals Group     BP 02/03/24 1049 (!) 147/85     Girls Systolic BP Percentile --      Girls Diastolic BP Percentile --      Boys Systolic BP Percentile --      Boys Diastolic BP Percentile --      Pulse Rate 02/03/24 1049 82     Resp 02/03/24 1049 18     Temp 02/03/24 1049 98 F (36.7 C)     Temp src --      SpO2 02/03/24 1049 100 %     Weight 02/03/24 1047 233 lb 15.4 oz (106.1 kg)     Height 02/03/24 1047 6' 4 (1.93 m)     Head Circumference --      Peak Flow --      Pain Score 02/03/24 1047 9     Pain Loc --      Pain Education --      Exclude from Growth Chart --     Most recent vital signs: Vitals:   02/03/24 1049  BP: (!) 147/85  Pulse: 82  Resp: 18  Temp: 98 F (36.7 C)  SpO2: 100%   I have reviewed the vital signs. General:  Awake, alert, no acute distress. Head:  Normocephalic, Atraumatic. EENT:  PERRL,  EOMI, Oral mucosa pink and moist, Neck is supple. Cardiovascular: Regular rate, 2+ distal pulses. Respiratory:  Normal respiratory effort, symmetrical expansion, no distress.  Abdomen: Soft, nontender, nondistended. Extremities:  Moving all four extremities through full ROM without pain.  Mild tenderness palpation along the left lateral posterior thoracic wall.  Pain symptoms worsen when lifting his arms or when rotating his trunk. Neuro:  Alert and oriented.  Interacting appropriately.   Skin:  Warm, dry, no rash.   Psych: Appropriate affect.    ED Results / Procedures / Treatments   Labs (all labs ordered are listed, but only abnormal results are displayed) Labs Reviewed - No data to display   EKG    RADIOLOGY Independently interpreted left rib x-rays with no acute pathology   PROCEDURES:  Critical Care performed: No  Procedures   MEDICATIONS ORDERED IN ED: Medications  lidocaine  (LIDODERM ) 5 % 1 patch (1 patch Transdermal Patch Applied 02/03/24 1136)  ketorolac  (TORADOL ) injection 30 mg (30 mg Intramuscular Given 02/03/24 1134)     IMPRESSION /  MDM / ASSESSMENT AND PLAN / ED COURSE  I reviewed the triage vital signs and the nursing notes.                              Differential diagnosis includes, but is not limited to, rib fracture, rib contusion, costochondritis, muscular strain  Patient's presentation is most consistent with acute complicated illness / injury requiring diagnostic workup.  Patient is a 63 year old male presenting today for left-sided rib pain/muscular strain.  He was seen in the ED yesterday for similar symptoms which had negative abdominal workup regarding prior pancreatic history as well as negative cardiac workup with 2 negative troponins.  His symptoms today appear more consistent with muscular strain as he has pain when he lifts his arm or rotates his trunk.  X-rays reveal no evidence of any fractures.  Denies any other new symptoms and chest  exam and abdominal exam is completely unremarkable.  Suspect likely muscular strain versus costochondritis and will discharge with Robaxin  as needed.  Also recommended NSAIDs.  Given strict return precautions and he is agreeable with plan.  Clinical Course as of 02/03/24 1203  Sun Feb 03, 2024  1138 DG Ribs Unilateral W/Chest Left No rib fractures [DW]    Clinical Course User Index [DW] Malvina Alm DASEN, MD     FINAL CLINICAL IMPRESSION(S) / ED DIAGNOSES   Final diagnoses:  Rib pain on left side     Rx / DC Orders   ED Discharge Orders          Ordered    methocarbamol  (ROBAXIN ) 500 MG tablet  Every 8 hours PRN        02/03/24 1203             Note:  This document was prepared using Dragon voice recognition software and may include unintentional dictation errors.   Malvina Alm DASEN, MD 02/03/24 [phone]

## 2024-02-03 NOTE — Discharge Instructions (Addendum)
 I have sent muscle relaxers to your pharmacy.  Suspect this is likely a muscular strain versus rib bruising versus costochondritis.  Either way, this should resolve within the next 1 to 2 weeks and you can take ibuprofen  and Tylenol  as needed.  Please follow-up with your primary care provider as needed.  Please return for any severe worsening symptoms such as significant chest pain or shortness of breath.

## 2024-02-07 ENCOUNTER — Other Ambulatory Visit: Payer: Self-pay | Admitting: Internal Medicine

## 2024-02-07 ENCOUNTER — Ambulatory Visit
Admission: RE | Admit: 2024-02-07 | Discharge: 2024-02-07 | Disposition: A | Discharge status: Home / Self Care | Source: Ambulatory Visit | Attending: Internal Medicine | Admitting: Internal Medicine

## 2024-02-07 DIAGNOSIS — R079 Chest pain, unspecified: Secondary | ICD-10-CM | POA: Diagnosis present

## 2024-02-07 DIAGNOSIS — R0600 Dyspnea, unspecified: Secondary | ICD-10-CM | POA: Diagnosis present

## 2024-02-07 MED ORDER — IOHEXOL 300 MG/ML  SOLN
75.0000 mL | Freq: Once | INTRAMUSCULAR | Status: AC | PRN
  Administered 2024-02-07: 75 mL via INTRAVENOUS

## 2024-02-14 ENCOUNTER — Other Ambulatory Visit: Payer: Self-pay | Admitting: Internal Medicine

## 2024-02-14 DIAGNOSIS — R140 Abdominal distension (gaseous): Secondary | ICD-10-CM

## 2024-02-14 DIAGNOSIS — E1165 Type 2 diabetes mellitus with hyperglycemia: Secondary | ICD-10-CM

## 2024-02-14 DIAGNOSIS — R1084 Generalized abdominal pain: Secondary | ICD-10-CM

## 2024-02-27 ENCOUNTER — Ambulatory Visit
Admission: RE | Admit: 2024-02-27 | Discharge: 2024-02-27 | Disposition: A | Discharge status: Home / Self Care | Source: Ambulatory Visit | Attending: Internal Medicine | Admitting: Internal Medicine

## 2024-02-27 DIAGNOSIS — R1084 Generalized abdominal pain: Secondary | ICD-10-CM | POA: Diagnosis present

## 2024-02-27 DIAGNOSIS — E1165 Type 2 diabetes mellitus with hyperglycemia: Secondary | ICD-10-CM | POA: Insufficient documentation

## 2024-02-27 DIAGNOSIS — R140 Abdominal distension (gaseous): Secondary | ICD-10-CM | POA: Insufficient documentation

## 2024-02-27 MED ORDER — IOHEXOL 300 MG/ML  SOLN
100.0000 mL | Freq: Once | INTRAMUSCULAR | Status: AC | PRN
  Administered 2024-02-27: 100 mL via INTRAVENOUS

## 2024-03-01 ENCOUNTER — Other Ambulatory Visit: Payer: Self-pay | Admitting: Cardiovascular Disease

## 2024-03-01 DIAGNOSIS — R9431 Abnormal electrocardiogram [ECG] [EKG]: Secondary | ICD-10-CM

## 2024-03-01 DIAGNOSIS — R0789 Other chest pain: Secondary | ICD-10-CM

## 2024-03-01 DIAGNOSIS — I10 Essential (primary) hypertension: Secondary | ICD-10-CM

## 2024-03-01 DIAGNOSIS — I340 Nonrheumatic mitral (valve) insufficiency: Secondary | ICD-10-CM

## 2024-03-01 DIAGNOSIS — E782 Mixed hyperlipidemia: Secondary | ICD-10-CM

## 2024-03-01 DIAGNOSIS — F17210 Nicotine dependence, cigarettes, uncomplicated: Secondary | ICD-10-CM

## 2024-03-01 DIAGNOSIS — I5032 Chronic diastolic (congestive) heart failure: Secondary | ICD-10-CM

## 2024-03-01 DIAGNOSIS — F17200 Nicotine dependence, unspecified, uncomplicated: Secondary | ICD-10-CM

## 2024-03-17 ENCOUNTER — Ambulatory Visit

## 2024-03-28 ENCOUNTER — Ambulatory Visit: Admitting: Student in an Organized Health Care Education/Training Program

## 2024-04-07 ENCOUNTER — Ambulatory Visit: Admitting: Cardiovascular Disease

## 2024-04-07 ENCOUNTER — Ambulatory Visit (INDEPENDENT_AMBULATORY_CARE_PROVIDER_SITE_OTHER): Admitting: Cardiovascular Disease

## 2024-04-07 ENCOUNTER — Encounter: Payer: Self-pay | Admitting: Cardiovascular Disease

## 2024-04-07 VITALS — BP 120/76 | HR 80 | Ht 74.0 in | Wt 228.6 lb

## 2024-04-07 DIAGNOSIS — F17210 Nicotine dependence, cigarettes, uncomplicated: Secondary | ICD-10-CM

## 2024-04-07 DIAGNOSIS — E782 Mixed hyperlipidemia: Secondary | ICD-10-CM | POA: Diagnosis not present

## 2024-04-07 DIAGNOSIS — I10 Essential (primary) hypertension: Secondary | ICD-10-CM

## 2024-04-07 DIAGNOSIS — R9431 Abnormal electrocardiogram [ECG] [EKG]: Secondary | ICD-10-CM

## 2024-04-07 DIAGNOSIS — I5033 Acute on chronic diastolic (congestive) heart failure: Secondary | ICD-10-CM

## 2024-04-07 NOTE — Progress Notes (Signed)
 Cardiology Office Note   Date:  04/07/2024   ID:  Joseph Daugherty, DOB 01-07-1961, MRN [id number]  PCP:  Sherial Bail, MD  Cardiologist:  Denyse Bathe, MD      History of Present Illness: Joseph Daugherty is a 63 y.o. male who presents for  Chief Complaint  Patient presents with   Follow-up    3 month follow up    Feels better, dry cough, but no SOB, no chest pain.      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     celecoxib (CELEBREX) 200 MG capsule Take 200 mg by mouth daily. (Patient taking differently: Take 200 mg by mouth as needed.)     Cholecalciferol (D 5000) 125 MCG (5000 UT) capsule Take 5,000 Units by mouth.     cyclobenzaprine (FLEXERIL) 5 MG tablet Take by mouth.     finasteride  (PROSCAR ) 5 MG tablet Take 1 tablet (5 mg total) by mouth daily. 90 tablet 3   gabapentin (NEURONTIN) 300 MG capsule Take 300 mg by mouth 2 (two) times daily. (Patient taking differently: Take 300 mg by mouth as needed.)     glipiZIDE (GLUCOTROL XL) 10 MG 24 hr tablet Take 10 mg by mouth daily with breakfast.     JARDIANCE 25 MG TABS tablet Take 25 mg by mouth daily.     lactulose (CHRONULAC) 10 GM/15ML solution SMARTSIG:15 Milliliter(s) By Mouth Twice Daily PRN      lisinopril-hydrochlorothiazide (ZESTORETIC) 20-12.5 MG tablet Take 1 tablet by mouth daily.     metFORMIN (GLUCOPHAGE) 1000 MG tablet Take 1,000 mg by mouth 2 (two) times daily.     methocarbamol  (ROBAXIN ) 500 MG tablet Take 1 tablet (500 mg total) by mouth every 8 (eight) hours as needed. 30 tablet 0   metoprolol succinate (TOPROL-XL) 25 MG 24 hr tablet Take 25 mg by mouth daily.     pioglitazone (ACTOS) 30 MG tablet Take 30 mg by mouth daily.     rosuvastatin (CRESTOR) 20 MG tablet Take 20 mg by mouth daily.     sildenafil (VIAGRA) 100 MG tablet TAKE (1) TABLET BY MOUTH 1 HOUR PRIOR TO SEXUAL ACTIVITY. ~MAX OF 1 TAB PER DAY~     sitaGLIPtin-metformin (JANUMET) 50-1000 MG tablet Take 1 tablet by mouth 2 (two) times daily with a meal.     spironolactone  (ALDACTONE ) 25 MG tablet Take 1/2 (one-half) tablet by mouth once daily 30 tablet 0   tamsulosin  (FLOMAX ) 0.4 MG CAPS capsule Take 1 capsule (0.4 mg total) by mouth daily. 90 capsule 3   No current facility-administered medications for this visit.    Allergies:   Penicillins    Social History:   reports that he has been smoking cigarettes. He started smoking about 44 years ago. He has a 43.9  pack-year smoking history. He has never used smokeless tobacco. He reports that he does not currently use drugs. He reports that he does not drink alcohol.   Family History:  family history is not on file.    ROS:     Review of Systems  Constitutional: Negative.   HENT: Negative.    Eyes: Negative.   Respiratory: Negative.    Gastrointestinal: Negative.   Genitourinary: Negative.   Musculoskeletal: Negative.   Skin: Negative.   Neurological: Negative.   Endo/Heme/Allergies: Negative.   Psychiatric/Behavioral: Negative.    All other systems reviewed and are negative.     All other systems are reviewed and negative.    PHYSICAL EXAM: VS:  BP 120/76   Pulse 80   Ht 6' 2 (1.88 m)   Wt 228 lb 9.6 oz (103.7 kg)   SpO2 96%   BMI 29.35  kg/m  , BMI Body mass index is 29.35 kg/m. Last weight:  Wt Readings from Last 3 Encounters:  04/07/24 228 lb 9.6 oz (103.7 kg)  02/03/24 233 lb 15.4 oz (106.1 kg)  02/02/24 234 lb (106.1 kg)     Physical Exam Vitals reviewed.  Constitutional:      Appearance: Normal appearance. He is normal weight.  HENT:     Head: Normocephalic.     Nose: Nose normal.     Mouth/Throat:     Mouth: Mucous membranes are moist.  Eyes:     Pupils: Pupils are equal, round, and reactive to light.  Cardiovascular:     Rate and Rhythm: Normal rate and regular rhythm.     Pulses: Normal pulses.     Heart sounds: Normal heart sounds.  Pulmonary:     Effort: Pulmonary effort is normal.  Abdominal:     General: Abdomen is flat. Bowel sounds are normal.  Musculoskeletal:        General: Normal range of motion.     Cervical back: Normal range of motion.  Skin:    General: Skin is warm.  Neurological:     General: No focal deficit present.     Mental Status: He is alert.  Psychiatric:        Mood and Affect: Mood normal.       EKG:   Recent Labs: 02/02/2024: ALT 14; BUN 19; Creatinine, Ser 0.99; Hemoglobin 13.0; Platelets 278; Potassium 4.6; Sodium 137    Lipid Panel    Component Value Date/Time   TRIG 30 02/02/2024 0851      Other studies Reviewed: Additional studies/ records that were reviewed today include:  Review of the above records demonstrates:       No data to display            ASSESSMENT AND PLAN:    ICD-10-CM   1. Hypertension, benign  I10     2. Cigarette nicotine dependence without complication  F17.210     3. Mixed hyperlipidemia  E78.2     4. Abnormal EKG  R94.31     5. CHF (congestive heart failure), NYHA class III, acute on chronic, diastolic (HCC)  I50.33    diastolic dysfunction, continue jaurdiance.       Problem List Items Addressed This Visit       Cardiovascular and Mediastinum   Hypertension, benign - Primary     Other   Abnormal  EKG   HLD (hyperlipidemia)   Nicotine dependence   Other Visit Diagnoses       CHF (congestive heart failure), NYHA  class III, acute on chronic, diastolic (HCC)       diastolic dysfunction, continue jaurdiance.          Disposition:   Return in about 3 months (around 07/07/2024).    Total time spent: 45 minutes  Signed,  Denyse Bathe, MD  04/07/2024 2:46 PM    [facility]

## 2024-04-11 ENCOUNTER — Other Ambulatory Visit: Payer: Self-pay

## 2024-04-11 DIAGNOSIS — J129 Viral pneumonia, unspecified: Secondary | ICD-10-CM

## 2024-04-11 DIAGNOSIS — R911 Solitary pulmonary nodule: Secondary | ICD-10-CM

## 2024-04-17 ENCOUNTER — Ambulatory Visit
Admission: RE | Admit: 2024-04-17 | Discharge: 2024-04-17 | Disposition: A | Discharge status: Home / Self Care | Source: Ambulatory Visit

## 2024-04-17 DIAGNOSIS — J129 Viral pneumonia, unspecified: Secondary | ICD-10-CM | POA: Diagnosis present

## 2024-04-17 DIAGNOSIS — R911 Solitary pulmonary nodule: Secondary | ICD-10-CM | POA: Diagnosis present

## 2024-04-17 MED ORDER — IOHEXOL 300 MG/ML  SOLN
75.0000 mL | Freq: Once | INTRAMUSCULAR | Status: AC | PRN
Start: 2024-04-17 — End: 2024-04-17
  Administered 2024-04-17: 75 mL via INTRAVENOUS

## 2024-04-23 ENCOUNTER — Encounter: Payer: Self-pay | Admitting: Urology

## 2024-04-23 ENCOUNTER — Other Ambulatory Visit: Payer: Self-pay | Admitting: Urology

## 2024-04-23 ENCOUNTER — Ambulatory Visit (INDEPENDENT_AMBULATORY_CARE_PROVIDER_SITE_OTHER): Payer: Self-pay | Admitting: Urology

## 2024-04-23 VITALS — BP 114/80 | HR 84 | Ht 73.0 in | Wt 230.0 lb

## 2024-04-23 DIAGNOSIS — N5201 Erectile dysfunction due to arterial insufficiency: Secondary | ICD-10-CM | POA: Diagnosis not present

## 2024-04-23 DIAGNOSIS — R972 Elevated prostate specific antigen [PSA]: Secondary | ICD-10-CM

## 2024-04-23 DIAGNOSIS — N401 Enlarged prostate with lower urinary tract symptoms: Secondary | ICD-10-CM

## 2024-04-23 DIAGNOSIS — C61 Malignant neoplasm of prostate: Secondary | ICD-10-CM

## 2024-04-23 MED ORDER — FINASTERIDE 5 MG PO TABS: 5.0000 mg | ORAL_TABLET | Freq: Every day | ORAL | 3 refills | Status: AC

## 2024-04-23 MED ORDER — TAMSULOSIN HCL 0.4 MG PO CAPS: 0.4000 mg | ORAL_CAPSULE | Freq: Every day | ORAL | 3 refills | Status: AC

## 2024-04-23 MED ORDER — TADALAFIL 20 MG PO TABS: ORAL_TABLET | ORAL | 0 refills | Status: DC

## 2024-04-23 NOTE — Progress Notes (Signed)
 04/23/2024 8:45 AM   Joseph Daugherty 1961-02-26 [id number]  Referring provider: Sherial Bail, MD [address] [place],  [place] [address]  Chief Complaint  Patient presents with   Prostate Cancer   Urologic history: 1.  T1c low risk prostate cancer Dx 01/2020; PSA 6.7; 72 g prostate 2 cores Gleason 3+3 < 10% Confirmatory biopsy performed 03/03/2022.  MRI prior to biopsy showed a 45 cc gland and no high-grade lesions TRUS volume 56 g; 12 core biopsy performed Path: 2/12 cores showed Gleason 3+3 adenocarcinoma (16%, 16%)   2.  BPH with LUTS On tamsulosin /finasteride  Finasteride  started 02/2020   3.  Erectile dysfunction   HPI: Joseph Daugherty is a 63 y.o. male presents for annual follow-up  No problems since last year's visit No bothersome LUTS; IPSS 6/35 Remains on tamsulosin /finasteride  Denies dysuria, gross hematuria No flank, abdominal or pelvic pain PSA 10/19/2023-1.3 Recent hospitalization [facility] for pneumonia   PSA trend    Prostate Specific Ag, Serum  Latest Ref Rng 0.0 - 4.0 ng/mL  01/09/2020 6.0 (H)   05/27/2020 2.6   12/01/2020 1.5   06/13/2021 1.9   12/13/2021 1.6   09/13/2022 19.7 (H)   10/13/2022 1.4   04/20/2023 1.2   10/19/2023 1.3    PMH: Past Medical History:  Diagnosis Date   BPH (benign prostatic hyperplasia) 10/21/2018   COPD (chronic obstructive pulmonary disease) (HCC)    Diabetes mellitus without complication (HCC)    Hyperlipidemia    Hypertension    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 If you have any questions, ask your nurse or doctor.          aspirin  81 MG chewable tablet Chew 81 mg by mouth daily.   celecoxib 200 MG capsule Commonly known as: CELEBREX Take 200 mg by mouth daily. What changed:  when to take this reasons to take this   cyclobenzaprine 5 MG tablet Commonly known as: FLEXERIL Take by mouth.   D 5000 125 MCG (5000 UT) capsule Generic drug: Cholecalciferol Take 5,000 Units by mouth.   finasteride  5 MG tablet Commonly known as: Proscar  Take 1 tablet (5 mg total) by mouth daily.   gabapentin 300 MG capsule Commonly known as: NEURONTIN Take 300 mg by mouth 2 (two) times daily. What changed:  when to take this reasons to take this   glipiZIDE 10 MG 24 hr tablet Commonly known as: GLUCOTROL XL Take 10 mg by mouth daily with breakfast.   Jardiance 25 MG Tabs tablet Generic drug: empagliflozin Take 25 mg by mouth daily.   lactulose 10 GM/15ML solution Commonly known as: CHRONULAC SMARTSIG:15 Milliliter(s) By Mouth Twice Daily PRN   lisinopril-hydrochlorothiazide 20-12.5 MG tablet Commonly known as: ZESTORETIC Take 1 tablet by mouth daily.   metFORMIN 1000 MG tablet Commonly known as: GLUCOPHAGE Take 1,000 mg by mouth 2 (two) times daily.   methocarbamol   500 MG tablet Commonly known as: ROBAXIN  Take 1 tablet (500 mg total) by mouth every 8 (eight) hours as needed.   metoprolol succinate 25 MG 24 hr tablet Commonly known as: TOPROL-XL Take 25 mg by mouth daily.   pioglitazone 30 MG tablet Commonly known as: ACTOS Take 30 mg by mouth daily.   rosuvastatin 20 MG tablet Commonly known as: CRESTOR Take 20 mg by mouth daily.   sildenafil 100 MG tablet Commonly known as: VIAGRA TAKE (1) TABLET BY MOUTH 1 HOUR PRIOR TO SEXUAL ACTIVITY. ~MAX OF 1 TAB PER DAY~   sitaGLIPtin-metformin 50-1000 MG  tablet Commonly known as: JANUMET Take 1 tablet by mouth 2 (two) times daily with a meal.   spironolactone  25 MG tablet Commonly known as: ALDACTONE  Take 1/2 (one-half) tablet by mouth once daily   tamsulosin  0.4 MG Caps capsule Commonly known as: FLOMAX  Take 1 capsule (0.4 mg total) by mouth daily.        Allergies:  Allergies  Allergen Reactions   Penicillins Hives and Anaphylaxis    Family History: No family history on file.  Social History:  reports that he has been smoking cigarettes. He started smoking about 44 years ago. He has a 43.9 pack-year smoking history. He has never used smokeless tobacco. He reports that he does not currently use drugs. He reports that he does not drink alcohol.   Physical Exam: BP 114/80   Pulse 84   Ht 6' 1 (1.854 m)   Wt 230 lb (104.3 kg)   BMI 30.34 kg/m   Constitutional:  Alert, No acute distress. HEENT: [place] AT Respiratory: Normal respiratory effort, no increased work of breathing. GU: Prostate 50 g, smooth without nodules Psychiatric: Normal mood and affect.   Assessment & Plan:    1.  T1c low risk prostate cancer Benign DRE PSA drawn today Lab visit 6 months PSA Office visit 1 year PSA/DRE  2.  BPH with LUTS Stable Tamsulosin /finasteride  refilled  3.  Erectile dysfunction Has noted decreased efficacy with sildenafil and desires a trial of tadalafil Rx 20 mg sent to pharmacy    Joseph JAYSON Barba, MD  [facility] [address], [address] [place], [place] [address] [phone]

## 2024-04-24 ENCOUNTER — Other Ambulatory Visit: Payer: Self-pay | Admitting: Cardiology

## 2024-04-24 ENCOUNTER — Ambulatory Visit: Payer: Self-pay | Admitting: Urology

## 2024-04-24 DIAGNOSIS — I5032 Chronic diastolic (congestive) heart failure: Secondary | ICD-10-CM

## 2024-04-24 DIAGNOSIS — F17200 Nicotine dependence, unspecified, uncomplicated: Secondary | ICD-10-CM

## 2024-04-24 DIAGNOSIS — R0789 Other chest pain: Secondary | ICD-10-CM

## 2024-04-24 DIAGNOSIS — F17210 Nicotine dependence, cigarettes, uncomplicated: Secondary | ICD-10-CM

## 2024-04-24 DIAGNOSIS — E782 Mixed hyperlipidemia: Secondary | ICD-10-CM

## 2024-04-24 DIAGNOSIS — I340 Nonrheumatic mitral (valve) insufficiency: Secondary | ICD-10-CM

## 2024-04-24 DIAGNOSIS — I10 Essential (primary) hypertension: Secondary | ICD-10-CM

## 2024-04-24 DIAGNOSIS — R9431 Abnormal electrocardiogram [ECG] [EKG]: Secondary | ICD-10-CM

## 2024-04-24 LAB — PSA: Prostate Specific Ag, Serum: 1 ng/mL (ref 0.0–4.0)

## 2024-04-29 ENCOUNTER — Encounter: Payer: Self-pay | Admitting: Urology

## 2024-05-15 ENCOUNTER — Ambulatory Visit: Admitting: Student in an Organized Health Care Education/Training Program

## 2024-05-23 ENCOUNTER — Other Ambulatory Visit: Payer: Self-pay | Admitting: Urology

## 2024-06-09 ENCOUNTER — Ambulatory Visit: Admitting: Cardiovascular Disease

## 2024-06-23 ENCOUNTER — Other Ambulatory Visit: Payer: Self-pay | Admitting: Cardiovascular Disease

## 2024-06-23 ENCOUNTER — Other Ambulatory Visit: Payer: Self-pay | Admitting: Urology

## 2024-06-23 DIAGNOSIS — I340 Nonrheumatic mitral (valve) insufficiency: Secondary | ICD-10-CM

## 2024-06-23 DIAGNOSIS — I5032 Chronic diastolic (congestive) heart failure: Secondary | ICD-10-CM

## 2024-06-23 DIAGNOSIS — E782 Mixed hyperlipidemia: Secondary | ICD-10-CM

## 2024-06-23 DIAGNOSIS — R0789 Other chest pain: Secondary | ICD-10-CM

## 2024-06-23 DIAGNOSIS — R9431 Abnormal electrocardiogram [ECG] [EKG]: Secondary | ICD-10-CM

## 2024-06-23 DIAGNOSIS — F17210 Nicotine dependence, cigarettes, uncomplicated: Secondary | ICD-10-CM

## 2024-06-23 DIAGNOSIS — F17200 Nicotine dependence, unspecified, uncomplicated: Secondary | ICD-10-CM

## 2024-06-23 DIAGNOSIS — I10 Essential (primary) hypertension: Secondary | ICD-10-CM

## 2024-07-12 ENCOUNTER — Encounter: Payer: Self-pay | Admitting: Emergency Medicine

## 2024-07-12 ENCOUNTER — Emergency Department
Admission: EM | Admit: 2024-07-12 | Discharge: 2024-07-12 | Disposition: A | Discharge status: Home / Self Care | Attending: Emergency Medicine | Admitting: Emergency Medicine

## 2024-07-12 ENCOUNTER — Other Ambulatory Visit: Payer: Self-pay

## 2024-07-12 DIAGNOSIS — J449 Chronic obstructive pulmonary disease, unspecified: Secondary | ICD-10-CM | POA: Insufficient documentation

## 2024-07-12 DIAGNOSIS — E119 Type 2 diabetes mellitus without complications: Secondary | ICD-10-CM | POA: Diagnosis not present

## 2024-07-12 DIAGNOSIS — L089 Local infection of the skin and subcutaneous tissue, unspecified: Secondary | ICD-10-CM | POA: Insufficient documentation

## 2024-07-12 DIAGNOSIS — I10 Essential (primary) hypertension: Secondary | ICD-10-CM | POA: Insufficient documentation

## 2024-07-12 LAB — CBC WITH DIFFERENTIAL/PLATELET
Abs Immature Granulocytes: 0.01 K/uL (ref 0.00–0.07)
Basophils Absolute: 0 K/uL (ref 0.0–0.1)
Basophils Relative: 0 %
Eosinophils Absolute: 0.2 K/uL (ref 0.0–0.5)
Eosinophils Relative: 2 %
HCT: 42 % (ref 39.0–52.0)
Hemoglobin: 13.6 g/dL (ref 13.0–17.0)
Immature Granulocytes: 0 %
Lymphocytes Relative: 43 %
Lymphs Abs: 4.1 K/uL — ABNORMAL HIGH (ref 0.7–4.0)
MCH: 28 pg (ref 26.0–34.0)
MCHC: 32.4 g/dL (ref 30.0–36.0)
MCV: 86.4 fL (ref 80.0–100.0)
Monocytes Absolute: 0.9 K/uL (ref 0.1–1.0)
Monocytes Relative: 9 %
Neutro Abs: 4.3 K/uL (ref 1.7–7.7)
Neutrophils Relative %: 46 %
Platelets: 281 K/uL (ref 150–400)
RBC: 4.86 MIL/uL (ref 4.22–5.81)
RDW: 14.6 % (ref 11.5–15.5)
WBC: 9.5 K/uL (ref 4.0–10.5)
nRBC: 0 % (ref 0.0–0.2)

## 2024-07-12 LAB — BASIC METABOLIC PANEL WITH GFR
Anion gap: 15 (ref 5–15)
BUN: 17 mg/dL (ref 8–23)
CO2: 25 mmol/L (ref 22–32)
Calcium: 9.3 mg/dL (ref 8.9–10.3)
Chloride: 100 mmol/L (ref 98–111)
Creatinine, Ser: 1.12 mg/dL (ref 0.61–1.24)
GFR, Estimated: >60 mL/min
Glucose, Bld: 180 mg/dL — ABNORMAL HIGH (ref 70–99)
Potassium: 4.2 mmol/L (ref 3.5–5.1)
Sodium: 140 mmol/L (ref 135–145)

## 2024-07-12 MED ORDER — MUPIROCIN 2 % EX OINT: 1.0000 | TOPICAL_OINTMENT | Freq: Two times a day (BID) | CUTANEOUS | 0 refills | Status: AC

## 2024-07-12 MED ORDER — CEPHALEXIN 500 MG PO CAPS: 500.0000 mg | ORAL_CAPSULE | Freq: Three times a day (TID) | ORAL | 0 refills | Status: AC

## 2024-07-12 MED ORDER — CEPHALEXIN 500 MG PO CAPS
500.0000 mg | ORAL_CAPSULE | Freq: Once | ORAL | Status: AC
  Administered 2024-07-12: 500 mg via ORAL
  Filled 2024-07-12: qty 1

## 2024-07-12 NOTE — ED Provider Notes (Signed)
 "  [facility] Provider Note    Event Date/Time   First MD Initiated Contact with Patient 07/12/24 2048     (approximate)   History   Foot Pain   HPI  Joseph Daugherty is a 63 y.o. male with a history of diabetes, hypertension, pancreatitis, COPD presents emergency department with concerns of infection in the left great toe.  Patient had an ingrown toenail removed on 07/04/2024.  States was doing okay but then noticed a drainage that is a clear to yellow drainage.  Is concerned he is getting an infection in the toe.  Denies fever or chills.  States area is painful and feels like he sees pus.      Physical Exam   Triage Vital Signs: ED Triage Vitals  Encounter Vitals Group     BP 07/12/24 1835 (!) 144/93     Girls Systolic BP Percentile --      Girls Diastolic BP Percentile --      Boys Systolic BP Percentile --      Boys Diastolic BP Percentile --      Pulse Rate 07/12/24 1835 91     Resp 07/12/24 1835 16     Temp 07/12/24 1835 98.2 F (36.8 C)     Temp Source 07/12/24 1835 Oral     SpO2 07/12/24 1835 96 %     Weight 07/12/24 1836 229 lb 4.5 oz (104 kg)     Height 07/12/24 1836 6' 1 (1.854 m)     Head Circumference --      Peak Flow --      Pain Score 07/12/24 1836 6     Pain Loc --      Pain Education --      Exclude from Growth Chart --     Most recent vital signs: Vitals:   07/12/24 1835  BP: (!) 144/93  Pulse: 91  Resp: 16  Temp: 98.2 F (36.8 C)  SpO2: 96%     General: Awake, no distress.   CV:  Good peripheral perfusion. Resp:  Normal effort.  Abd:  No distention.   Other:  Left great toe with tenderness, swelling, possible paronychia, some swelling, neurovascular intact, no bony tenderness   ED Results / Procedures / Treatments   Labs (all labs ordered are listed, but only abnormal results are displayed) Labs Reviewed  CBC WITH DIFFERENTIAL/PLATELET - Abnormal; Notable for the following components:      Result Value    Lymphs Abs 4.1 (*)    All other components within normal limits  BASIC METABOLIC PANEL WITH GFR - Abnormal; Notable for the following components:   Glucose, Bld 180 (*)    All other components within normal limits     EKG     RADIOLOGY     PROCEDURES:   Procedures  Critical Care:  no Chief Complaint  Patient presents with   Foot Pain      MEDICATIONS ORDERED IN ED: Medications  cephALEXin  (KEFLEX ) capsule 500 mg (500 mg Oral Given 07/12/24 2123)     IMPRESSION / MDM / ASSESSMENT AND PLAN / ED COURSE  I reviewed the triage vital signs and the nursing notes.                              Differential diagnosis includes, but is not limited to, cellulitis, osteomyelitis, wound infection, ingrown toenail  Patient's presentation is most consistent with acute  illness / injury with system symptoms.   Medications given: Keflex  1 p.o.  Labs are reassuring  I did explain findings to patient.  Do not feel that he has a severe infection requiring hospitalization, IV antibiotics.  However I do think he would benefit from an oral antibiotic and topical antibiotic cream.  He is to follow-up with Dr. Neill who did remove the previous toenail.  Return emergency department worsening.  At discharge he was given a prescription for Keflex , Bactroban  ointment.  He was given a Keflex  here in the ED.  Follow-up with his doctor as needed, return for worsening, discharged stable condition.      FINAL CLINICAL IMPRESSION(S) / ED DIAGNOSES   Final diagnoses:  Wound infection     Rx / DC Orders   ED Discharge Orders          Ordered    cephALEXin  (KEFLEX ) 500 MG capsule  3 times daily        07/12/24 2109    mupirocin  ointment (BACTROBAN ) 2 %  2 times daily        07/12/24 2109             Note:  This document was prepared using Dragon voice recognition software and may include unintentional dictation errors.    Gasper Devere ORN, PA-C 07/12/24 2126     Viviann Pastor, MD 07/12/24 [phone]  "

## 2024-07-12 NOTE — ED Triage Notes (Signed)
 Pt had left big toenail removed last Friday. Pt states it is painful and draining. Pt diabetic and concerned of infection.

## 2024-07-12 NOTE — Discharge Instructions (Signed)
 Follow-up with your podiatrist for your already scheduled appointment.  Return to emergency department worsening.  Take the antibiotics as prescribed.  Apply the ointment twice daily.  Keep a dressing on the area while it is draining.

## 2024-07-27 ENCOUNTER — Other Ambulatory Visit: Payer: Self-pay | Admitting: Urology

## 2024-08-20 ENCOUNTER — Other Ambulatory Visit: Payer: Self-pay | Admitting: Cardiovascular Disease

## 2024-08-20 DIAGNOSIS — F17210 Nicotine dependence, cigarettes, uncomplicated: Secondary | ICD-10-CM

## 2024-08-20 DIAGNOSIS — I340 Nonrheumatic mitral (valve) insufficiency: Secondary | ICD-10-CM

## 2024-08-20 DIAGNOSIS — E782 Mixed hyperlipidemia: Secondary | ICD-10-CM

## 2024-08-20 DIAGNOSIS — R0789 Other chest pain: Secondary | ICD-10-CM

## 2024-08-20 DIAGNOSIS — I10 Essential (primary) hypertension: Secondary | ICD-10-CM

## 2024-08-20 DIAGNOSIS — F17200 Nicotine dependence, unspecified, uncomplicated: Secondary | ICD-10-CM

## 2024-08-20 DIAGNOSIS — R9431 Abnormal electrocardiogram [ECG] [EKG]: Secondary | ICD-10-CM

## 2024-08-20 DIAGNOSIS — I5032 Chronic diastolic (congestive) heart failure: Secondary | ICD-10-CM

## 2024-10-22 ENCOUNTER — Other Ambulatory Visit

## 2025-04-17 ENCOUNTER — Other Ambulatory Visit

## 2025-04-24 ENCOUNTER — Ambulatory Visit: Admitting: Urology
